# Patient Record
Sex: Female | Born: 1950 | Race: White | Hispanic: No | Smoking: Former smoker
Health system: Southern US, Community
[De-identification: ages and names within clinical notes are randomized; demographics above are authoritative.]

## PROBLEM LIST (undated history)

## (undated) DIAGNOSIS — R51 Headache: Secondary | ICD-10-CM

## (undated) DIAGNOSIS — F419 Anxiety disorder, unspecified: Secondary | ICD-10-CM

## (undated) DIAGNOSIS — G5601 Carpal tunnel syndrome, right upper limb: Secondary | ICD-10-CM

## (undated) DIAGNOSIS — G5600 Carpal tunnel syndrome, unspecified upper limb: Secondary | ICD-10-CM

## (undated) DIAGNOSIS — G8929 Other chronic pain: Secondary | ICD-10-CM

## (undated) DIAGNOSIS — M1990 Unspecified osteoarthritis, unspecified site: Secondary | ICD-10-CM

## (undated) DIAGNOSIS — K219 Gastro-esophageal reflux disease without esophagitis: Secondary | ICD-10-CM

## (undated) DIAGNOSIS — M25512 Pain in left shoulder: Secondary | ICD-10-CM

## (undated) HISTORY — PX: CHOLECYSTECTOMY: SHX55

## (undated) HISTORY — PX: TUBAL LIGATION: SHX77

## (undated) HISTORY — PX: APPENDECTOMY: SHX54

---

## 2008-07-01 ENCOUNTER — Emergency Department (HOSPITAL_COMMUNITY): Admission: EM | Admit: 2008-07-01 | Discharge: 2008-07-01 | Payer: Self-pay | Admitting: Emergency Medicine

## 2008-09-14 ENCOUNTER — Emergency Department (HOSPITAL_COMMUNITY): Admission: EM | Admit: 2008-09-14 | Discharge: 2008-09-14 | Payer: Self-pay | Admitting: Emergency Medicine

## 2010-04-30 LAB — URINALYSIS, ROUTINE W REFLEX MICROSCOPIC
Leukocytes, UA: NEGATIVE
Nitrite: NEGATIVE
Specific Gravity, Urine: <1.005 — ABNORMAL LOW (ref 1.005–1.030)
Urobilinogen, UA: 0.2 mg/dL (ref 0.0–1.0)
pH: 6.5 (ref 5.0–8.0)

## 2010-04-30 LAB — COMPREHENSIVE METABOLIC PANEL
AST: 46 U/L — ABNORMAL HIGH (ref 0–37)
BUN: 5 mg/dL — ABNORMAL LOW (ref 6–23)
CO2: 28 mEq/L (ref 19–32)
Chloride: 103 mEq/L (ref 96–112)
Creatinine, Ser: 0.79 mg/dL (ref 0.4–1.2)
GFR calc Af Amer: >60 mL/min (ref >60)
GFR calc non Af Amer: >60 mL/min (ref >60)
Total Bilirubin: 0.3 mg/dL (ref 0.3–1.2)

## 2010-04-30 LAB — CBC
HCT: 35.8 % — ABNORMAL LOW (ref 36.0–46.0)
MCV: 91.2 fL (ref 78.0–100.0)
RBC: 3.93 MIL/uL (ref 3.87–5.11)
WBC: 8.1 10*3/uL (ref 4.0–10.5)

## 2010-04-30 LAB — URINE MICROSCOPIC-ADD ON

## 2010-04-30 LAB — DIFFERENTIAL
Basophils Absolute: 0.1 10*3/uL (ref 0.0–0.1)
Basophils Relative: 1 % (ref 0–1)
Eosinophils Relative: 5 % (ref 0–5)
Lymphocytes Relative: 33 % (ref 12–46)

## 2010-04-30 LAB — LIPASE, BLOOD: Lipase: 11 U/L (ref 11–59)

## 2011-02-07 ENCOUNTER — Ambulatory Visit (INDEPENDENT_AMBULATORY_CARE_PROVIDER_SITE_OTHER): Payer: Medicaid Other | Admitting: Orthopedic Surgery

## 2011-02-07 ENCOUNTER — Encounter: Payer: Self-pay | Admitting: Orthopedic Surgery

## 2011-02-07 VITALS — Ht 62.0 in | Wt 167.0 lb

## 2011-02-07 DIAGNOSIS — G5600 Carpal tunnel syndrome, unspecified upper limb: Secondary | ICD-10-CM

## 2011-02-07 NOTE — Patient Instructions (Signed)
Start new medication continue hydrocodone   You have been scheduled for surgery  Please Go to your preoperative appointment and bring the folder that was given to you today  Please stop all blood thinners ibuprofen Naprosyn aspirin Plavix Coumadin

## 2011-02-14 ENCOUNTER — Telehealth: Payer: Self-pay | Admitting: Orthopedic Surgery

## 2011-02-14 ENCOUNTER — Encounter (HOSPITAL_COMMUNITY): Payer: Self-pay | Admitting: Pharmacy Technician

## 2011-02-14 NOTE — Telephone Encounter (Signed)
Melissa Webb from [facility], called with the following pre-op appointment information:  Surgery date: 02/24/11 (Friday), 10:30am, to follow first case  Pre-op appt:  02/17/11 (Friday)  10:45am  Patient had called 02/13/11 to inquire about surgery information.  Her ph#'s are: [phone] [facility]) and [phone] [facility]).

## 2011-02-14 NOTE — Telephone Encounter (Signed)
Patient is aware of surgery and pre op appts

## 2011-02-14 NOTE — Telephone Encounter (Signed)
Called patient, left message.

## 2011-02-15 ENCOUNTER — Other Ambulatory Visit: Payer: Self-pay | Admitting: *Deleted

## 2011-02-15 ENCOUNTER — Encounter: Payer: Self-pay | Admitting: Orthopedic Surgery

## 2011-02-15 DIAGNOSIS — G5600 Carpal tunnel syndrome, unspecified upper limb: Secondary | ICD-10-CM | POA: Insufficient documentation

## 2011-02-15 MED ORDER — HYDROCODONE-ACETAMINOPHEN 5-500 MG PO TABS: 1.0000 | ORAL_TABLET | ORAL | Status: DC | PRN

## 2011-02-15 NOTE — Progress Notes (Signed)
Patient ID: Melissa Webb, female   DOB: May 03, 1950, 61 y.o.   MRN: [id number] Peripheral from [facility]  RIGHT hand severe pain  The patient is presenting at the request of the neurologist for evaluation regarding carpal tunnel syndrome of the upper extremities.  The RIGHT hand is noted to have severe pain she is a positive carpal tunnel test.  Pain started in 2002 in the hand and wrist.  She was diagnosed with arthritis.  2 years ago she started having numbness and tingling in the RIGHT upper extremity and symptoms have gradually worsened.  Previous treatment includes steroids and Lortab.  These have been minimally effective.  She is now having sharp throbbing stabbing burning 10 out of 10 constant pain associated with numbness tingling swelling and difficulty holding small objects, twisting off tops and lifting.  Her pain is relieved with ice and partially with pain medication.  Previous treatment includes bracing.  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  She is not interested in continuing with nonoperative care.  She understands that she may have some wrist tenderness over the incision for 6 months.  She wishes to proceed with open RIGHT carpal tunnel release  Examination Ht 5\' 2"  (1.575 m)  Wt 167 lb (75.751 kg)  BMI 30.54 kg/m2 Gen. Appearance normal.  She also would be best described as endomorphic body habitus.  No gross deformities are seen.  She is oriented x3 her mood and affect are somewhat.  Has no gait disturbances.  The pulse, temperature and perfusion of the hands is normal there is no varicosities.  Mild swelling.  No edema.  Lymph nodes are normal in the upper extremities.  She has decreased sensation globally in the hand bilaterally worse in the median nerve distribution but also involving the small finger.  Reflexes are normal.   Balance normal.  Provocative test for carpal tunnel include a positive Phalen's test.  Positive carpal tunnel compression test on the RIGHT.  The hand on the RIGHT is swollen range of motion is limited she has some arthritic nodules as well.  There is no wrist or hand joint instability.  She has decreased grip strength.  The LEFT upper extremity is less symptomatic with minimal swelling some motion deficits no instability mild strength deficit.  Impression bilateral carpal tunnel syndrome RIGHT greater than LEFT  Plan RIGHT carpal tunnel release open technique  Informed consent was done in the office.

## 2011-02-15 NOTE — Telephone Encounter (Signed)
Called back to patient to schedule post op appointment

## 2011-02-17 ENCOUNTER — Encounter (HOSPITAL_COMMUNITY): Admission: RE | Admit: 2011-02-17 | Payer: Medicaid Other | Source: Ambulatory Visit

## 2011-02-20 ENCOUNTER — Encounter (HOSPITAL_COMMUNITY)
Admission: RE | Admit: 2011-02-20 | Discharge: 2011-02-20 | Disposition: A | Discharge status: Home / Self Care | Payer: Medicaid Other | Source: Ambulatory Visit | Attending: Orthopedic Surgery | Admitting: Orthopedic Surgery

## 2011-02-20 ENCOUNTER — Encounter (HOSPITAL_COMMUNITY): Payer: Self-pay

## 2011-02-20 HISTORY — DX: Gastro-esophageal reflux disease without esophagitis: K21.9

## 2011-02-20 HISTORY — DX: Anxiety disorder, unspecified: F41.9

## 2011-02-20 HISTORY — DX: Unspecified osteoarthritis, unspecified site: M19.90

## 2011-02-20 LAB — SURGICAL PCR SCREEN: Staphylococcus aureus: NEGATIVE

## 2011-02-20 NOTE — Patient Instructions (Addendum)
20 Melissa Webb  02/20/2011   Your procedure is scheduled on:  02/24/2011  Report to [facility] at  900  AM.  Call this number if you have problems the morning of surgery: [phone]   Remember:   Do not eat food:After Midnight.  May have clear liquids:until Midnight .  Clear liquids include soda, tea, black coffee, apple or grape juice, broth.  Take these medicines the morning of surgery with A SIP OF WATER: hydrocodone or vistaril   Do not wear jewelry, make-up or nail polish.  Do not wear lotions, powders, or perfumes. You may wear deodorant.  Do not shave 48 hours prior to surgery.  Do not bring valuables to the hospital.  Contacts, dentures or bridgework may not be worn into surgery.  Leave suitcase in the car. After surgery it may be brought to your room.  For patients admitted to the hospital, checkout time is 11:00 AM the day of discharge.   Patients discharged the day of surgery will not be allowed to drive home.  Name and phone number of your driver: family  Special Instructions: CHG Shower Use Special Wash: 1/2 bottle night before surgery and 1/2 bottle morning of surgery.   Please read over the following fact sheets that you were given: Coughing and Deep Breathing, MRSA Information, Surgical Site Infection Prevention, Anesthesia Post-op Instructions and Care and Recovery After Surgery Carpal Tunnel Release Carpal tunnel release is done to relieve the pressure on the nerves and tendons on the bottom side of your wrist.  LET YOUR CAREGIVER KNOW ABOUT:   Allergies to food or medicine.   Medicines taken, including vitamins, herbs, eyedrops, over-the-counter medicines, and creams.   Use of steroids (by mouth or creams).   Previous problems with anesthetics or numbing medicines.   History of bleeding problems or blood clots.   Previous surgery.   Other health problems, including diabetes and kidney problems.   Possibility of pregnancy, if this applies.  RISKS AND  COMPLICATIONS  Some problems that may happen after this procedure include:  Infection.   Damage to the nerves, arteries or tendons could occur. This would be very uncommon.   Bleeding.  BEFORE THE PROCEDURE   This surgery may be done while you are asleep (general anesthetic) or may be done under a block where only your forearm and the surgical area is numb.   If the surgery is done under a block, the numbness will gradually wear off within several hours after surgery.  HOME CARE INSTRUCTIONS   Have a responsible person with you for 24 hours.   Do not drive a car or use public transportation for 24 hours.   Only take over-the-counter or prescription medicines for pain, discomfort, or fever as directed by your caregiver. Take them as directed.   You may put ice on the palm side of the affected wrist.   Put ice in a plastic bag.   Place a towel between your skin and the bag.   Leave the ice on for 20 to 30 minutes, 4 times per day.   If you were given a splint to keep your wrist from bending, use it as directed. It is important to wear the splint at night or as directed. Use the splint for as long as you have pain or numbness in your hand, arm, or wrist. This may take 1 to 2 months.   Keep your hand raised (elevated) above the level of your heart as much as possible. This  keeps swelling down and helps with discomfort.   Change bandages (dressings) as directed.   Keep the wound clean and dry.  SEEK MEDICAL CARE IF:   You develop pain not relieved with medications.   You develop numbness of your hand.   You develop bleeding from your surgical site.   You have an oral temperature above 102 F (38.9 C).   You develop redness or swelling of the surgical site.   You develop new, unexplained problems.  SEEK IMMEDIATE MEDICAL CARE IF:   You develop a rash.   You have difficulty breathing.   You develop any reaction or side effects to medications given.  Document  Released: 04/01/2003 Document Revised: 09/21/2010 Document Reviewed: 11/15/2006 [facility] Patient Information 2012 [place], [name].PATIENT INSTRUCTIONS POST-ANESTHESIA  IMMEDIATELY FOLLOWING SURGERY:  Do not drive or operate machinery for the first twenty four hours after surgery.  Do not make any important decisions for twenty four hours after surgery or while taking narcotic pain medications or sedatives.  If you develop intractable nausea and vomiting or a severe headache please notify your doctor immediately.  FOLLOW-UP:  Please make an appointment with your surgeon as instructed. You do not need to follow up with anesthesia unless specifically instructed to do so.  WOUND CARE INSTRUCTIONS (if applicable):  Keep a dry clean dressing on the anesthesia/puncture wound site if there is drainage.  Once the wound has quit draining you may leave it open to air.  Generally you should leave the bandage intact for twenty four hours unless there is drainage.  If the epidural site drains for more than 36-48 hours please call the anesthesia department.  QUESTIONS?:  Please feel free to call your physician or the hospital operator if you have any questions, and they will be happy to assist you.     [facility] Anesthesia Department [address] [place] [phone]

## 2011-02-22 ENCOUNTER — Telehealth: Payer: Self-pay | Admitting: Orthopedic Surgery

## 2011-02-22 NOTE — Telephone Encounter (Signed)
Contacted insurer Medicaid's contact for pre-authorization ([employer]) [phone], re: out-patient surgery scheduled 02/24/11 at [facility], CPT [phone], ICD9 code 354.0.  Per automated voice response system, no pre-authorization required.

## 2011-02-23 NOTE — H&P (Signed)
  RIGHT hand severe pain  The patient is presenting at the request of the neurologist for evaluation regarding carpal tunnel syndrome of the upper extremities. The RIGHT hand is noted to have severe pain she is a positive carpal tunnel test. Pain started in 2002 in the hand and wrist. She was diagnosed with arthritis. 2 years ago she started having numbness and tingling in the RIGHT upper extremity and symptoms have gradually worsened. Previous treatment includes steroids and Lortab. These have been minimally effective.  She is now having sharp throbbing stabbing burning 10 out of 10 constant pain associated with numbness tingling swelling and difficulty holding small objects, twisting off tops and lifting. Her pain is relieved with ice and partially with pain medication.  Previous treatment includes bracing. 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  She is not interested in continuing with nonoperative care. She understands that she may have some wrist tenderness over the incision for 6 months.  She wishes to proceed with open RIGHT carpal tunnel release   Past Medical History  Diagnosis Date  . GERD (gastroesophageal reflux disease)   . Anxiety   . Arthritis     osteoarthritis    Past Surgical History  Procedure Date  . Appendectomy   . Cholecystectomy   . Tubal ligation     History   Social History  . Marital Status: Divorced    Spouse Name: N/A    Number of Children: N/A  . Years of Education: N/A   Occupational History  . Not on file.   Social History Main Topics  . Smoking status: Former Smoker -- 0.2 packs/day for 10 years    Types: Cigarettes    Quit date: 02/19/1990  . Smokeless tobacco: Not on file  . Alcohol Use: No  . Drug Use: No  . Sexually Active: Yes    Birth Control/ Protection: Post-menopausal   Other Topics  Concern  . Not on file   Social History Narrative  . No narrative on file    ROS see hpi otherwise normal  Examination  Ht 5\' 2"  (1.575 m)  Wt 167 lb (75.751 kg)  BMI 30.54 kg/m2  Gen. Appearance normal. She also would be best described as endomorphic body habitus. No gross deformities are seen. She is oriented x3 her mood and affect are somewhat.  Has no gait disturbances. The pulse, temperature and perfusion of the hands is normal there is no varicosities. Mild swelling. No edema.  Lymph nodes are normal in the upper extremities.  She has decreased sensation globally in the hand bilaterally worse in the median nerve distribution but also involving the small finger.  Reflexes are normal. Balance normal.  Provocative test for carpal tunnel include a positive Phalen's test. Positive carpal tunnel compression test on the RIGHT.  The hand on the RIGHT is swollen range of motion is limited she has some arthritic nodules as well. There is no wrist or hand joint instability. She has decreased grip strength.  The LEFT upper extremity is less symptomatic with minimal swelling some motion deficits no instability mild strength deficit.  Impression bilateral carpal tunnel syndrome RIGHT greater than LEFT  Plan RIGHT carpal tunnel release open technique  Informed consent was done in the office.

## 2011-02-24 ENCOUNTER — Ambulatory Visit (HOSPITAL_COMMUNITY)
Admission: RE | Admit: 2011-02-24 | Discharge: 2011-02-24 | Disposition: A | Discharge status: Home / Self Care | Payer: Medicaid Other | Source: Ambulatory Visit | Attending: Orthopedic Surgery | Admitting: Orthopedic Surgery

## 2011-02-24 ENCOUNTER — Ambulatory Visit (HOSPITAL_COMMUNITY): Payer: Medicaid Other | Admitting: Anesthesiology

## 2011-02-24 ENCOUNTER — Encounter (HOSPITAL_COMMUNITY): Payer: Self-pay | Admitting: *Deleted

## 2011-02-24 ENCOUNTER — Encounter (HOSPITAL_COMMUNITY): Payer: Self-pay | Admitting: Anesthesiology

## 2011-02-24 ENCOUNTER — Encounter (HOSPITAL_COMMUNITY)
Admission: RE | Disposition: A | Discharge status: Home / Self Care | Payer: Self-pay | Source: Ambulatory Visit | Attending: Orthopedic Surgery

## 2011-02-24 DIAGNOSIS — G5600 Carpal tunnel syndrome, unspecified upper limb: Secondary | ICD-10-CM

## 2011-02-24 DIAGNOSIS — Z01812 Encounter for preprocedural laboratory examination: Secondary | ICD-10-CM | POA: Insufficient documentation

## 2011-02-24 HISTORY — PX: CARPAL TUNNEL RELEASE: SHX101

## 2011-02-24 SURGERY — CARPAL TUNNEL RELEASE
Anesthesia: Regional | Site: Wrist | Laterality: Right | Wound class: Clean

## 2011-02-24 MED ORDER — FENTANYL CITRATE 0.05 MG/ML IJ SOLN
INTRAMUSCULAR | Status: AC
  Administered 2011-02-24: 50 ug via INTRAVENOUS
  Filled 2011-02-24: qty 2

## 2011-02-24 MED ORDER — CEFAZOLIN SODIUM 1-5 GM-% IV SOLN
INTRAVENOUS | Status: AC
  Filled 2011-02-24: qty 50

## 2011-02-24 MED ORDER — SODIUM CHLORIDE 0.9 % IR SOLN
Status: DC | PRN
  Administered 2011-02-24: 1000 mL

## 2011-02-24 MED ORDER — OXYCODONE-ACETAMINOPHEN 5-325 MG PO TABS
1.0000 | ORAL_TABLET | Freq: Once | ORAL | Status: AC
  Administered 2011-02-24: 1 via ORAL

## 2011-02-24 MED ORDER — ONDANSETRON HCL 4 MG/2ML IJ SOLN: 4.0000 mg | Freq: Once | INTRAMUSCULAR | Status: DC | PRN

## 2011-02-24 MED ORDER — GLYCOPYRROLATE 0.2 MG/ML IJ SOLN
0.2000 mg | Freq: Once | INTRAMUSCULAR | Status: AC
  Administered 2011-02-24: 0.2 mg via INTRAVENOUS

## 2011-02-24 MED ORDER — CHLORHEXIDINE GLUCONATE 4 % EX LIQD
60.0000 mL | Freq: Once | CUTANEOUS | Status: DC
  Filled 2011-02-24: qty 60

## 2011-02-24 MED ORDER — CEFAZOLIN SODIUM 1-5 GM-% IV SOLN: 1.0000 g | INTRAVENOUS | Status: DC

## 2011-02-24 MED ORDER — PROPOFOL 10 MG/ML IV EMUL
INTRAVENOUS | Status: DC | PRN
  Administered 2011-02-24 (×2): 50 ug/kg/min via INTRAVENOUS

## 2011-02-24 MED ORDER — OXYCODONE-ACETAMINOPHEN 5-325 MG PO TABS
ORAL_TABLET | ORAL | Status: AC
  Administered 2011-02-24: 1 via ORAL
  Filled 2011-02-24: qty 1

## 2011-02-24 MED ORDER — LIDOCAINE HCL (PF) 0.5 % IJ SOLN
INTRAMUSCULAR | Status: DC | PRN
  Administered 2011-02-24: 50 mL

## 2011-02-24 MED ORDER — MIDAZOLAM HCL 2 MG/2ML IJ SOLN
1.0000 mg | INTRAMUSCULAR | Status: DC | PRN
  Administered 2011-02-24 (×2): 2 mg via INTRAVENOUS

## 2011-02-24 MED ORDER — LACTATED RINGERS IV SOLN
INTRAVENOUS | Status: DC
  Administered 2011-02-24: 1000 mL via INTRAVENOUS

## 2011-02-24 MED ORDER — ACETAMINOPHEN 325 MG PO TABS
ORAL_TABLET | ORAL | Status: AC
  Administered 2011-02-24: 325 mg via ORAL
  Filled 2011-02-24: qty 1

## 2011-02-24 MED ORDER — LIDOCAINE HCL 1 % IJ SOLN
INTRAMUSCULAR | Status: DC | PRN
  Administered 2011-02-24: 10 mg via INTRADERMAL

## 2011-02-24 MED ORDER — GLYCOPYRROLATE 0.2 MG/ML IJ SOLN
INTRAMUSCULAR | Status: AC
  Filled 2011-02-24: qty 1

## 2011-02-24 MED ORDER — CEFAZOLIN SODIUM 1-5 GM-% IV SOLN
INTRAVENOUS | Status: DC | PRN
  Administered 2011-02-24: 1 g via INTRAVENOUS

## 2011-02-24 MED ORDER — KETOROLAC TROMETHAMINE 30 MG/ML IJ SOLN
INTRAMUSCULAR | Status: AC
  Administered 2011-02-24: 30 mg via INTRAVENOUS
  Filled 2011-02-24: qty 1

## 2011-02-24 MED ORDER — ACETAMINOPHEN 325 MG PO TABS
325.0000 mg | ORAL_TABLET | ORAL | Status: DC | PRN
  Administered 2011-02-24: 325 mg via ORAL

## 2011-02-24 MED ORDER — BUPIVACAINE HCL (PF) 0.5 % IJ SOLN
INTRAMUSCULAR | Status: DC | PRN
  Administered 2011-02-24: 30 mL

## 2011-02-24 MED ORDER — BUPIVACAINE HCL (PF) 0.5 % IJ SOLN
INTRAMUSCULAR | Status: AC
  Filled 2011-02-24: qty 30

## 2011-02-24 MED ORDER — FENTANYL CITRATE 0.05 MG/ML IJ SOLN
25.0000 ug | INTRAMUSCULAR | Status: DC | PRN
  Administered 2011-02-24 (×4): 50 ug via INTRAVENOUS

## 2011-02-24 MED ORDER — MIDAZOLAM HCL 2 MG/2ML IJ SOLN
INTRAMUSCULAR | Status: AC
  Administered 2011-02-24: 2 mg via INTRAVENOUS
  Filled 2011-02-24: qty 2

## 2011-02-24 MED ORDER — LIDOCAINE HCL (PF) 1 % IJ SOLN
INTRAMUSCULAR | Status: AC
  Filled 2011-02-24: qty 5

## 2011-02-24 MED ORDER — KETOROLAC TROMETHAMINE 30 MG/ML IJ SOLN
30.0000 mg | Freq: Once | INTRAMUSCULAR | Status: AC
  Administered 2011-02-24: 30 mg via INTRAVENOUS

## 2011-02-24 MED ORDER — OXYCODONE-ACETAMINOPHEN 5-325 MG PO TABS: 1.0000 | ORAL_TABLET | ORAL | Status: AC | PRN

## 2011-02-24 MED ORDER — SODIUM CHLORIDE 0.9 % IJ SOLN
INTRAMUSCULAR | Status: AC
  Filled 2011-02-24: qty 10

## 2011-02-24 MED ORDER — LIDOCAINE HCL (PF) 0.5 % IJ SOLN
INTRAMUSCULAR | Status: AC
  Filled 2011-02-24: qty 50

## 2011-02-24 MED ORDER — FENTANYL CITRATE 0.05 MG/ML IJ SOLN
INTRAMUSCULAR | Status: DC | PRN
  Administered 2011-02-24: 50 ug via INTRAVENOUS

## 2011-02-24 MED ORDER — PROPOFOL 10 MG/ML IV EMUL
INTRAVENOUS | Status: AC
  Filled 2011-02-24: qty 20

## 2011-02-24 SURGICAL SUPPLY — 33 items
BAG HAMPER (MISCELLANEOUS) ×2 IMPLANT
BANDAGE ELASTIC 3 VELCRO NS (GAUZE/BANDAGES/DRESSINGS) ×2 IMPLANT
BANDAGE ESMARK 4X12 BL STRL LF (DISPOSABLE) ×1 IMPLANT
BANDAGE GAUZE ELAST BULKY 4 IN (GAUZE/BANDAGES/DRESSINGS) ×2 IMPLANT
BNDG COHESIVE 4X5 TAN NS LF (GAUZE/BANDAGES/DRESSINGS) ×2 IMPLANT
BNDG ESMARK 4X12 BLUE STRL LF (DISPOSABLE) ×2
CHLORAPREP W/TINT 26ML (MISCELLANEOUS) ×2 IMPLANT
CLOTH BEACON ORANGE TIMEOUT ST (SAFETY) ×2 IMPLANT
COVER LIGHT HANDLE STERIS (MISCELLANEOUS) ×4 IMPLANT
CUFF TOURNIQUET SINGLE 18IN (TOURNIQUET CUFF) ×2 IMPLANT
DECANTER SPIKE VIAL GLASS SM (MISCELLANEOUS) ×2 IMPLANT
DRSG XEROFORM 1X8 (GAUZE/BANDAGES/DRESSINGS) ×2 IMPLANT
ELECT NEEDLE TIP 2.8 STRL (NEEDLE) ×2 IMPLANT
ELECT REM PT RETURN 9FT ADLT (ELECTROSURGICAL) ×2
ELECTRODE REM PT RTRN 9FT ADLT (ELECTROSURGICAL) ×1 IMPLANT
GLOVE ECLIPSE 6.5 STRL STRAW (GLOVE) ×2 IMPLANT
GLOVE EXAM NITRILE MD LF STRL (GLOVE) ×2 IMPLANT
GLOVE INDICATOR 7.0 STRL GRN (GLOVE) ×2 IMPLANT
GLOVE SKINSENSE NS SZ8.0 LF (GLOVE) ×1
GLOVE SKINSENSE STRL SZ8.0 LF (GLOVE) ×1 IMPLANT
GLOVE SS N UNI LF 8.5 STRL (GLOVE) ×2 IMPLANT
GOWN STRL REIN XL XLG (GOWN DISPOSABLE) ×6 IMPLANT
HAND ALUMI XLG (SOFTGOODS) ×2 IMPLANT
KIT ROOM TURNOVER APOR (KITS) ×2 IMPLANT
MANIFOLD NEPTUNE II (INSTRUMENTS) ×2 IMPLANT
NEEDLE HYPO 21X1.5 SAFETY (NEEDLE) ×2 IMPLANT
NS IRRIG 1000ML POUR BTL (IV SOLUTION) ×2 IMPLANT
PACK BASIC LIMB (CUSTOM PROCEDURE TRAY) ×2 IMPLANT
PAD ARMBOARD 7.5X6 YLW CONV (MISCELLANEOUS) ×2 IMPLANT
SET BASIN LINEN APH (SET/KITS/TRAYS/PACK) ×2 IMPLANT
SPONGE GAUZE 4X4 12PLY (GAUZE/BANDAGES/DRESSINGS) ×2 IMPLANT
SUT ETHILON 3 0 FSL (SUTURE) ×2 IMPLANT
SYR 30ML LL (SYRINGE) ×2 IMPLANT

## 2011-02-24 NOTE — Op Note (Signed)
02/24/2011  10:40 AM  PATIENT:  Melissa Webb  61 y.o. female  PRE-OPERATIVE DIAGNOSIS:  carpal tunnel syndrome - right  POST-OPERATIVE DIAGNOSIS:  carpal tunnel syndrome - right  PROCEDURE:  Procedure(s): CARPAL TUNNEL RELEASE  SURGEON:  Surgeon(s): Fuller Canada, MD  PHYSICIAN ASSISTANT:   ASSISTANTS: none   ANESTHESIA:   regional  EBL:  Total I/O In: 500 [I.V.:500] Out: 0   BLOOD ADMINISTERED:none  DRAINS: none   LOCAL MEDICATIONS USED:  MARCAINE 30 CC  SPECIMEN:  No Specimen  DISPOSITION OF SPECIMEN:  N/A  COUNTS:  YES  TOURNIQUET:   Total Tourniquet Time Documented: Upper Arm (Right) - 29 minutes  DICTATION: .Dragon Dictation  PLAN OF CARE: Discharge to home after PACU  PATIENT DISPOSITION:  PACU - hemodynamically stable.   Delay start of Pharmacological VTE agent (>24hrs) due to surgical blood loss or risk of bleeding:  NA  Details of procedure  The right hand was confirmed as a surgical site marked as such. Chart was reviewed and updated. The patient was taken to the operating room given a gram of Ancef. She Bier block the right upper extremity.  After sterile prep and drape the timeout procedure was executed.  A straight incision was made over the carpal tunnel. Subcutaneous tissue was divided down to the palmar fascia. Blunt dissection was carried out to the distal aspect of the transverse carpal ligament. Abundant treatment was passed beneath the ligament and the ligament was released sharply. The median nerve was found to be severely compressed with a severe apple core appearance with discoloration.  The wound was irrigated no space-occupying lesions in the carpal tunnel.  After irrigation the wound is closed with 3-0 interrupted nylon sutures and injected with 30 cc of Marcaine plain  Followed by sterile dressing  The tourniquet was released and the fingers have good color and capillary refill

## 2011-02-24 NOTE — Anesthesia Procedure Notes (Addendum)
Anesthesia Regional Block:  Bier block (IV Regional)  Pre-Anesthetic Checklist: ,, timeout performed, Correct Patient, Correct Site, Correct Laterality, Correct Procedure,, site marked, risks and benefits discussed, Surgical consent, Pre-op evaluation  Laterality: Right  Prep: alcohol swabs        Motor weakness within 3 minutes. Bier block (IV Regional) Narrative:   Performed by: Personally  Resident/CRNA: Glynn Octave, CRNA  Additional Notes: Tourniquet checked, Rt arm exsanguinated with esmarch, #22 IV in Rt hand, checked for blood return and injected 50cc .5% lidocaine.

## 2011-02-24 NOTE — Brief Op Note (Signed)
02/24/2011  10:40 AM  PATIENT:  Melissa Webb  61 y.o. female  PRE-OPERATIVE DIAGNOSIS:  carpal tunnel syndrome - right  POST-OPERATIVE DIAGNOSIS:  carpal tunnel syndrome - right  PROCEDURE:  Procedure(s): CARPAL TUNNEL RELEASE  SURGEON:  Surgeon(s): Fuller Canada, MD  PHYSICIAN ASSISTANT:   ASSISTANTS: none   ANESTHESIA:   regional  EBL:  Total I/O In: 500 [I.V.:500] Out: 0   BLOOD ADMINISTERED:none  DRAINS: none   LOCAL MEDICATIONS USED:  MARCAINE 30 CC  SPECIMEN:  No Specimen  DISPOSITION OF SPECIMEN:  N/A  COUNTS:  YES  TOURNIQUET:   Total Tourniquet Time Documented: Upper Arm (Right) - 29 minutes  DICTATION: .Dragon Dictation  PLAN OF CARE: Discharge to home after PACU  PATIENT DISPOSITION:  PACU - hemodynamically stable.   Delay start of Pharmacological VTE agent (>24hrs) due to surgical blood loss or risk of bleeding:  NA

## 2011-02-24 NOTE — Transfer of Care (Signed)
Immediate Anesthesia Transfer of Care Note  Patient: Melissa Webb  Procedure(s) Performed:  CARPAL TUNNEL RELEASE  Patient Location: PACU  Anesthesia Type: MAC  Level of Consciousness: awake, alert  and oriented  Airway & Oxygen Therapy: Patient Spontanous Breathing and Patient connected to nasal cannula oxygen  Post-op Assessment: Report given to PACU RN  Post vital signs: Reviewed and stable  Complications: No apparent anesthesia complications

## 2011-02-24 NOTE — Anesthesia Postprocedure Evaluation (Signed)
  Anesthesia Post-op Note  Patient: Melissa Webb  Procedure(s) Performed:  CARPAL TUNNEL RELEASE  Patient Location: PACU  Anesthesia Type: MAC  Level of Consciousness: awake, alert  and oriented  Airway and Oxygen Therapy: Patient Spontanous Breathing  Post-op Pain: none  Post-op Assessment: Post-op Vital signs reviewed, Patient's Cardiovascular Status Stable, Respiratory Function Stable and No signs of Nausea or vomiting  Post-op Vital Signs: Reviewed and stable  Complications: No apparent anesthesia complications

## 2011-02-24 NOTE — Anesthesia Preprocedure Evaluation (Signed)
Anesthesia Evaluation  Patient identified by MRN, date of birth, ID band Patient awake    Reviewed: Allergy & Precautions, H&P , NPO status , Patient's Chart, lab work & pertinent test results  Airway Mallampati: I TM Distance: >3 FB Neck ROM: Full    Dental No notable dental hx.    Pulmonary    Pulmonary exam normal       Cardiovascular neg cardio ROS Regular Normal    Neuro/Psych Anxiety  Neuromuscular disease    GI/Hepatic Neg liver ROS, GERD-  Medicated and Controlled,  Endo/Other  Negative Endocrine ROS  Renal/GU negative Renal ROS  Genitourinary negative   Musculoskeletal negative musculoskeletal ROS (+)   Abdominal Normal abdominal exam  (+)   Peds  Hematology negative hematology ROS (+)   Anesthesia Other Findings   Reproductive/Obstetrics negative OB ROS                           Anesthesia Physical Anesthesia Plan  ASA: I  Anesthesia Plan: Bier Block   Post-op Pain Management:    Induction: Intravenous  Airway Management Planned: Nasal Cannula  Additional Equipment:   Intra-op Plan:   Post-operative Plan:   Informed Consent: I have reviewed the patients History and Physical, chart, labs and discussed the procedure including the risks, benefits and alternatives for the proposed anesthesia with the patient or authorized representative who has indicated his/her understanding and acceptance.     Plan Discussed with: CRNA  Anesthesia Plan Comments:         Anesthesia Quick Evaluation

## 2011-02-24 NOTE — Interval H&P Note (Signed)
History and Physical Interval Note:  02/24/2011 9:51 AM  Melissa Webb  has presented today for surgery, with the diagnosis of carpal tunnel syndrome - right  The various methods of treatment have been discussed with the patient and family. After consideration of risks, benefits and other options for treatment, the patient has consented to  Procedure(s): right  CARPAL TUNNEL RELEASE as a surgical intervention .  The patients' history has been reviewed, patient examined, no change in status, stable for surgery.  I have reviewed the patients' chart and labs.  Questions were answered to the patient's satisfaction.     Fuller Canada

## 2011-02-27 ENCOUNTER — Encounter (HOSPITAL_COMMUNITY): Payer: Self-pay | Admitting: Orthopedic Surgery

## 2011-02-27 ENCOUNTER — Ambulatory Visit (INDEPENDENT_AMBULATORY_CARE_PROVIDER_SITE_OTHER): Payer: Medicaid Other | Admitting: Orthopedic Surgery

## 2011-02-27 VITALS — Ht 62.0 in | Wt 167.0 lb

## 2011-02-27 DIAGNOSIS — Z9889 Other specified postprocedural states: Secondary | ICD-10-CM

## 2011-02-27 MED ORDER — HYDROCODONE-ACETAMINOPHEN 10-325 MG PO TABS: 1.0000 | ORAL_TABLET | ORAL | Status: DC | PRN

## 2011-02-27 NOTE — Patient Instructions (Signed)
Keep clean and dry change bandage as needed

## 2011-02-27 NOTE — Progress Notes (Signed)
Patient ID: Melissa Webb, female   DOB: 25-May-1950, 61 y.o.   MRN: [id number]  Postop day #3  Postop visit #1  Open RIGHT carpal tunnel release  Patient is doing well her incision is clean.  The Percocet gave her some upper GI discomfort and she would like a prescription for hydrocodone  She is given hydrocodone as Norco 10 mg one q. 4 p.r.n. Pain #50 for return date set for February 13

## 2011-03-08 ENCOUNTER — Ambulatory Visit (INDEPENDENT_AMBULATORY_CARE_PROVIDER_SITE_OTHER): Payer: Medicaid Other | Admitting: Orthopedic Surgery

## 2011-03-08 ENCOUNTER — Encounter: Payer: Self-pay | Admitting: Orthopedic Surgery

## 2011-03-08 DIAGNOSIS — Z9889 Other specified postprocedural states: Secondary | ICD-10-CM

## 2011-03-08 DIAGNOSIS — L0390 Cellulitis, unspecified: Secondary | ICD-10-CM

## 2011-03-08 DIAGNOSIS — L0291 Cutaneous abscess, unspecified: Secondary | ICD-10-CM

## 2011-03-08 MED ORDER — CEPHALEXIN 500 MG PO CAPS: 500.0000 mg | ORAL_CAPSULE | Freq: Four times a day (QID) | ORAL | Status: AC

## 2011-03-08 MED ORDER — HYDROCODONE-ACETAMINOPHEN 10-325 MG PO TABS: 1.0000 | ORAL_TABLET | ORAL | Status: DC | PRN

## 2011-03-08 NOTE — Progress Notes (Signed)
Patient ID: Melissa Webb, female   DOB: Aug 08, 1950, 61 y.o.   MRN: [id number] Chief Complaint  Patient presents with  . Routine Post Op    post op 2, Rt CT, DOS 02/24/11   The patient decided to wash her dog her wound got wet and opened up it drained she called the ER they never called her back her wound is separated she is surrounding synovitis no expressible purulence  Stitches were removed antibiotic ointment was applied with a sterile dressing.  She was started on Keflex 4 times a day come back in a week for dressing check and change

## 2011-03-08 NOTE — Patient Instructions (Signed)
Keep clean and dry   Dr will change dressing

## 2011-03-15 ENCOUNTER — Encounter: Payer: Self-pay | Admitting: Orthopedic Surgery

## 2011-03-15 ENCOUNTER — Ambulatory Visit (INDEPENDENT_AMBULATORY_CARE_PROVIDER_SITE_OTHER): Payer: Medicaid Other | Admitting: Orthopedic Surgery

## 2011-03-15 VITALS — Ht 62.0 in | Wt 167.0 lb

## 2011-03-15 DIAGNOSIS — G5600 Carpal tunnel syndrome, unspecified upper limb: Secondary | ICD-10-CM

## 2011-03-15 MED ORDER — GABAPENTIN 100 MG PO CAPS: 100.0000 mg | ORAL_CAPSULE | Freq: Three times a day (TID) | ORAL | Status: DC

## 2011-03-15 NOTE — Patient Instructions (Addendum)
Change dressing daily   Finish antibiotics

## 2011-03-15 NOTE — Progress Notes (Signed)
Patient ID: Melissa Webb, female   DOB: 12-14-1950, 61 y.o.   MRN: [id number] Chief Complaint  Patient presents with  . Follow-up    1 wwek recheck and dressing change of right CT.   A stab wound infection RIGHT carpal tunnel.  Patient on Keflex doing well  Requests Neurontin since it helped with her LEFT carpal tunnel symptoms and we are awaiting wound healing from the RIGHT carpal tunnel release we'll address surgical treatment of the LEFT carpal tunnels  Start Neurontin 100 mg t.i.d.

## 2011-03-27 ENCOUNTER — Other Ambulatory Visit: Payer: Self-pay | Admitting: *Deleted

## 2011-03-27 MED ORDER — HYDROCODONE-ACETAMINOPHEN 7.5-325 MG PO TABS
1.0000 | ORAL_TABLET | ORAL | Status: DC | PRN
Start: 2011-03-27 — End: 2011-04-05

## 2011-04-05 ENCOUNTER — Ambulatory Visit (INDEPENDENT_AMBULATORY_CARE_PROVIDER_SITE_OTHER): Payer: Medicaid Other | Admitting: Orthopedic Surgery

## 2011-04-05 ENCOUNTER — Encounter: Payer: Self-pay | Admitting: Orthopedic Surgery

## 2011-04-05 VITALS — Ht 62.0 in | Wt 167.0 lb

## 2011-04-05 DIAGNOSIS — G5600 Carpal tunnel syndrome, unspecified upper limb: Secondary | ICD-10-CM

## 2011-04-05 MED ORDER — HYDROCODONE-ACETAMINOPHEN 7.5-325 MG PO TABS: 1.0000 | ORAL_TABLET | ORAL | Status: DC | PRN

## 2011-04-05 NOTE — Patient Instructions (Addendum)
Left carpal tunnel release  Surgery scheduled for April 14, 2011 @ 10:35am Pre op @ [facility] short stay April 10, 2011 12:45 Risks and benefits as before Carpal Tunnel Release Carpal tunnel release is done to relieve the pressure on the nerves and tendons on the bottom side of your wrist.   LET YOUR CAREGIVER KNOW ABOUT:    Allergies to food or medicine.   Medicines taken, including vitamins, herbs, eyedrops, over-the-counter medicines, and creams.   Use of steroids (by mouth or creams).   Previous problems with anesthetics or numbing medicines.   History of bleeding problems or blood clots.   Previous surgery.   Other health problems, including diabetes and kidney problems.   Possibility of pregnancy, if this applies.  RISKS AND COMPLICATIONS   Some problems that may happen after this procedure include:  Infection.   Damage to the nerves, arteries or tendons could occur. This would be very uncommon.   Bleeding.  BEFORE THE PROCEDURE    This surgery may be done while you are asleep (general anesthetic) or may be done under a block where only your forearm and the surgical area is numb.   If the surgery is done under a block, the numbness will gradually wear off within several hours after surgery.  HOME CARE INSTRUCTIONS    Have a responsible person with you for 24 hours.   Do not drive a car or use public transportation for 24 hours.   Only take over-the-counter or prescription medicines for pain, discomfort, or fever as directed by your caregiver. Take them as directed.   You may put ice on the palm side of the affected wrist.   Put ice in a plastic bag.   Place a towel between your skin and the bag.   Leave the ice on for 20 to 30 minutes, 4 times per day.   If you were given a splint to keep your wrist from bending, use it as directed. It is important to wear the splint at night or as directed. Use the splint for as long as you have pain or numbness in your hand, arm,  or wrist. This may take 1 to 2 months.   Keep your hand raised (elevated) above the level of your heart as much as possible. This keeps swelling down and helps with discomfort.   Change bandages (dressings) as directed.   Keep the wound clean and dry.  SEEK MEDICAL CARE IF:    You develop pain not relieved with medications.   You develop numbness of your hand.   You develop bleeding from your surgical site.   You have an oral temperature above 102 F (38.9 C).   You develop redness or swelling of the surgical site.   You develop new, unexplained problems.  SEEK IMMEDIATE MEDICAL CARE IF:    You develop a rash.   You have difficulty breathing.   You develop any reaction or side effects to medications given.  Document Released: 04/01/2003 Document Revised: 12/29/2010 Document Reviewed: 11/15/2006 [facility] Patient Information 2012 [place], [name].

## 2011-04-05 NOTE — Progress Notes (Signed)
Patient ID: Melissa Webb, female   DOB: July 29, 1950, 61 y.o.   MRN: [id number] Chief Complaint  Patient presents with  . Follow-up    Recheck incision.     Postop RIGHT carpal tunnel release had a wound infection treated with p.o. Antibiotics he'll nicely no symptoms at this time  LEFT carpal tunnel syndrome positive nerve study would like to have LEFT done  Preoperative LEFT carpal tunnel release.

## 2011-04-06 ENCOUNTER — Telehealth: Payer: Self-pay | Admitting: Orthopedic Surgery

## 2011-04-06 NOTE — Telephone Encounter (Signed)
Contacted Medicaid's prior authorization contact, [employer], ph# [phone], re: out-patient surgery scheduled for 04/14/11 at [facility].  CPT code: 09811, ICD9 code 354.0.  Per automated voice response system, no pre-authorization is required.

## 2011-04-07 NOTE — Patient Instructions (Addendum)
22 Melissa Webb  04/07/2011   Your procedure is scheduled on:   04/14/2011  Report to [facility] at  850  AM.  Call this number if you have problems the morning of surgery: [phone]   Remember:   Do not eat food:After Midnight.  May have clear liquids:until Midnight .  Clear liquids include soda, tea, black coffee, apple or grape juice, broth.  Take these medicines the morning of surgery with A SIP OF WATER:  Neurontin,norco,xanax,hydroxyzine   Do not wear jewelry, make-up or nail polish.  Do not wear lotions, powders, or perfumes. You may wear deodorant.  Do not shave 48 hours prior to surgery.  Do not bring valuables to the hospital.  Contacts, dentures or bridgework may not be worn into surgery.  Leave suitcase in the car. After surgery it may be brought to your room.  For patients admitted to the hospital, checkout time is 11:00 AM the day of discharge.   Patients discharged the day of surgery will not be allowed to drive home.  Name and phone number of your driver: family  Special Instructions: CHG Shower Use Special Wash: 1/2 bottle night before surgery and 1/2 bottle morning of surgery.   Please read over the following fact sheets that you were given: Pain Booklet, MRSA Information, Surgical Site Infection Prevention, Anesthesia Post-op Instructions and Care and Recovery After Surgery Carpal Tunnel Release Carpal tunnel release is done to relieve the pressure on the nerves and tendons on the bottom side of your wrist.  LET YOUR CAREGIVER KNOW ABOUT:   Allergies to food or medicine.   Medicines taken, including vitamins, herbs, eyedrops, over-the-counter medicines, and creams.   Use of steroids (by mouth or creams).   Previous problems with anesthetics or numbing medicines.   History of bleeding problems or blood clots.   Previous surgery.   Other health problems, including diabetes and kidney problems.   Possibility of pregnancy, if this applies.  RISKS AND  COMPLICATIONS  Some problems that may happen after this procedure include:  Infection.   Damage to the nerves, arteries or tendons could occur. This would be very uncommon.   Bleeding.  BEFORE THE PROCEDURE   This surgery may be done while you are asleep (general anesthetic) or may be done under a block where only your forearm and the surgical area is numb.   If the surgery is done under a block, the numbness will gradually wear off within several hours after surgery.  HOME CARE INSTRUCTIONS   Have a responsible person with you for 24 hours.   Do not drive a car or use public transportation for 24 hours.   Only take over-the-counter or prescription medicines for pain, discomfort, or fever as directed by your caregiver. Take them as directed.   You may put ice on the palm side of the affected wrist.   Put ice in a plastic bag.   Place a towel between your skin and the bag.   Leave the ice on for 20 to 30 minutes, 4 times per day.   If you were given a splint to keep your wrist from bending, use it as directed. It is important to wear the splint at night or as directed. Use the splint for as long as you have pain or numbness in your hand, arm, or wrist. This may take 1 to 2 months.   Keep your hand raised (elevated) above the level of your heart as much as possible. This keeps  swelling down and helps with discomfort.   Change bandages (dressings) as directed.   Keep the wound clean and dry.  SEEK MEDICAL CARE IF:   You develop pain not relieved with medications.   You develop numbness of your hand.   You develop bleeding from your surgical site.   You have an oral temperature above 102 F (38.9 C).   You develop redness or swelling of the surgical site.   You develop new, unexplained problems.  SEEK IMMEDIATE MEDICAL CARE IF:   You develop a rash.   You have difficulty breathing.   You develop any reaction or side effects to medications given.  Document  Released: 04/01/2003 Document Revised: 12/29/2010 Document Reviewed: 11/15/2006 [facility] Patient Information 2012 Melissa Webb, [name].PATIENT INSTRUCTIONS POST-ANESTHESIA  IMMEDIATELY FOLLOWING SURGERY:  Do not drive or operate machinery for the first twenty four hours after surgery.  Do not make any important decisions for twenty four hours after surgery or while taking narcotic pain medications or sedatives.  If you develop intractable nausea and vomiting or a severe headache please notify your doctor immediately.  FOLLOW-UP:  Please make an appointment with your surgeon as instructed. You do not need to follow up with anesthesia unless specifically instructed to do so.  WOUND CARE INSTRUCTIONS (if applicable):  Keep a dry clean dressing on the anesthesia/puncture wound site if there is drainage.  Once the wound has quit draining you may leave it open to air.  Generally you should leave the bandage intact for twenty four hours unless there is drainage.  If the epidural site drains for more than 36-48 hours please call the anesthesia department.  QUESTIONS?:  Please feel free to call your physician or the hospital operator if you have any questions, and they will be happy to assist you.     [facility] Anesthesia Department [address] [place] [phone]

## 2011-04-10 ENCOUNTER — Encounter (HOSPITAL_COMMUNITY)
Admission: RE | Admit: 2011-04-10 | Discharge: 2011-04-10 | Disposition: A | Discharge status: Home / Self Care | Payer: Medicaid Other | Source: Ambulatory Visit | Attending: Orthopedic Surgery | Admitting: Orthopedic Surgery

## 2011-04-10 ENCOUNTER — Encounter (HOSPITAL_COMMUNITY): Payer: Self-pay

## 2011-04-10 ENCOUNTER — Encounter (HOSPITAL_COMMUNITY): Payer: Self-pay | Admitting: Pharmacy Technician

## 2011-04-10 LAB — SURGICAL PCR SCREEN
MRSA, PCR: NEGATIVE
Staphylococcus aureus: NEGATIVE

## 2011-04-10 LAB — HEMOGLOBIN AND HEMATOCRIT, BLOOD: Hemoglobin: 13.4 g/dL (ref 12.0–15.0)

## 2011-04-13 NOTE — H&P (Signed)
Melissa Webb is an 61 y.o. female.   Chief Complaint: PAIN AND PARESTHESIAS LEFT HAND  HPI: This is a 61 year old female status post right carpal tunnel release she has similar pain and paresthesias with numbness and tingling and weakness of the left upper extremity and presents for left carpal tunnel release.  Past Medical History  Diagnosis Date  . GERD (gastroesophageal reflux disease)   . Anxiety   . Arthritis     osteoarthritis    Past Surgical History  Procedure Date  . Appendectomy   . Cholecystectomy   . Tubal ligation   . Carpal tunnel release 02/24/2011    Procedure: CARPAL TUNNEL RELEASE;  Surgeon: Fuller Canada, MD;  Location: [facility];  Service: Orthopedics;  Laterality: Right;    Family History  Problem Relation Age of Onset  . Heart disease    . Arthritis    . Cancer    . Anesthesia problems Neg Hx   . Hypotension Neg Hx   . Malignant hyperthermia Neg Hx   . Pseudochol deficiency Neg Hx    Social History:  reports that she quit smoking about 21 years ago. Her smoking use included Cigarettes. She has a 2.5 pack-year smoking history. She does not have any smokeless tobacco history on file. She reports that she does not drink alcohol or use illicit drugs.  Allergies: No Known Allergies  No current facility-administered medications on file as of .   Medications Prior to Admission  Medication Sig Dispense Refill  . alprazolam (XANAX) 2 MG tablet Take 2 mg by mouth 2 (two) times daily as needed. For anxiety      . aspirin EC 81 MG tablet Take 81 mg by mouth daily.      Marland Kitchen doxycycline (VIBRA-TABS) 100 MG tablet Take 100 mg by mouth 2 (two) times daily.      Marland Kitchen gabapentin (NEURONTIN) 100 MG capsule Take 1 capsule (100 mg total) by mouth 3 (three) times daily.  60 capsule  2  . HYDROcodone-acetaminophen (NORCO) 7.5-325 MG per tablet Take 1 tablet by mouth every 4 (four) hours as needed. For pain      . hydrOXYzine (ATARAX/VISTARIL) 10 MG tablet Take 20-30 mg by mouth 2  (two) times daily. 30 mg in the morning and 20 mg at bedtime        No results found for this or any previous visit (from the past 48 hour(s)). No results found.  ROS  There were no vitals taken for this visit. Physical Exam  Physical Exam  Musculoskeletal:       Vital signs are stable as recorded  General appearance is normal  The patient is alert and oriented x3  The patient's mood and affect are normal  Gait assessment: NORMAL  The cardiovascular exam reveals normal pulses and temperature without edema swelling.  The lymphatic system is negative for palpable lymph nodes  The sensory exam is ABNORMAL ON THE LEFT HAND DECREASED SENSTAION IN THE THUMB. INDEX AND LONG FINGERS   TINELS NORMAL   PHALENS +  There are no pathologic reflexes.  Balance is normal.   Exam of the LEFT HAND AND WRIST  Inspection TENDERNESS IN THE CARPAL TUNNEL AREA Range of motion NORMAL  Stability NORMAL  Strength MILD WEAKNESS WITH POWER GRIP  Skin RUBOR     Assessment/Plan LEFT CTR FOR LEFT CARPAL TUNNEL SYNDROME   Fuller Canada 04/13/2011, 1:24 PM

## 2011-04-14 ENCOUNTER — Ambulatory Visit (HOSPITAL_COMMUNITY)
Admission: RE | Admit: 2011-04-14 | Discharge: 2011-04-14 | Disposition: A | Discharge status: Home / Self Care | Payer: Medicaid Other | Source: Ambulatory Visit | Attending: Orthopedic Surgery | Admitting: Orthopedic Surgery

## 2011-04-14 ENCOUNTER — Ambulatory Visit (HOSPITAL_COMMUNITY): Payer: Medicaid Other | Admitting: Anesthesiology

## 2011-04-14 ENCOUNTER — Encounter (HOSPITAL_COMMUNITY)
Admission: RE | Disposition: A | Discharge status: Home / Self Care | Payer: Self-pay | Source: Ambulatory Visit | Attending: Orthopedic Surgery

## 2011-04-14 ENCOUNTER — Encounter (HOSPITAL_COMMUNITY): Payer: Self-pay | Admitting: *Deleted

## 2011-04-14 ENCOUNTER — Encounter (HOSPITAL_COMMUNITY): Payer: Self-pay | Admitting: Anesthesiology

## 2011-04-14 DIAGNOSIS — Z01812 Encounter for preprocedural laboratory examination: Secondary | ICD-10-CM | POA: Insufficient documentation

## 2011-04-14 DIAGNOSIS — Z7982 Long term (current) use of aspirin: Secondary | ICD-10-CM | POA: Insufficient documentation

## 2011-04-14 DIAGNOSIS — G5600 Carpal tunnel syndrome, unspecified upper limb: Secondary | ICD-10-CM | POA: Insufficient documentation

## 2011-04-14 HISTORY — PX: CARPAL TUNNEL RELEASE: SHX101

## 2011-04-14 SURGERY — CARPAL TUNNEL RELEASE
Anesthesia: Regional | Site: Wrist | Laterality: Left | Wound class: Clean

## 2011-04-14 MED ORDER — CEFAZOLIN SODIUM 1-5 GM-% IV SOLN: 1.0000 g | INTRAVENOUS | Status: DC

## 2011-04-14 MED ORDER — LIDOCAINE HCL (PF) 0.5 % IJ SOLN
INTRAMUSCULAR | Status: DC | PRN
  Administered 2011-04-14: 50 mL via INTRAVENOUS

## 2011-04-14 MED ORDER — MIDAZOLAM HCL 2 MG/2ML IJ SOLN
INTRAMUSCULAR | Status: AC
  Filled 2011-04-14: qty 2

## 2011-04-14 MED ORDER — MIDAZOLAM HCL 5 MG/5ML IJ SOLN
INTRAMUSCULAR | Status: DC | PRN
  Administered 2011-04-14 (×2): 1 mg via INTRAVENOUS

## 2011-04-14 MED ORDER — CEFAZOLIN SODIUM 1-5 GM-% IV SOLN
INTRAVENOUS | Status: DC | PRN
  Administered 2011-04-14: 1 g via INTRAVENOUS

## 2011-04-14 MED ORDER — FENTANYL CITRATE 0.05 MG/ML IJ SOLN
INTRAMUSCULAR | Status: AC
  Administered 2011-04-14: 50 ug via INTRAVENOUS
  Filled 2011-04-14: qty 2

## 2011-04-14 MED ORDER — SODIUM CHLORIDE 0.9 % IR SOLN
Status: DC | PRN
  Administered 2011-04-14: 1000 mL

## 2011-04-14 MED ORDER — BUPIVACAINE HCL (PF) 0.5 % IJ SOLN
INTRAMUSCULAR | Status: AC
  Filled 2011-04-14: qty 30

## 2011-04-14 MED ORDER — ACETAMINOPHEN 10 MG/ML IV SOLN
INTRAVENOUS | Status: AC
  Administered 2011-04-14: 1000 mg via INTRAVENOUS
  Filled 2011-04-14: qty 100

## 2011-04-14 MED ORDER — FENTANYL CITRATE 0.05 MG/ML IJ SOLN
INTRAMUSCULAR | Status: DC | PRN
  Administered 2011-04-14 (×2): 50 ug via INTRAVENOUS

## 2011-04-14 MED ORDER — CHLORHEXIDINE GLUCONATE 4 % EX LIQD
60.0000 mL | Freq: Once | CUTANEOUS | Status: DC
  Filled 2011-04-14: qty 60

## 2011-04-14 MED ORDER — LIDOCAINE HCL (PF) 1 % IJ SOLN
INTRAMUSCULAR | Status: AC
  Filled 2011-04-14: qty 5

## 2011-04-14 MED ORDER — ONDANSETRON HCL 4 MG/2ML IJ SOLN
INTRAMUSCULAR | Status: AC
  Administered 2011-04-14: 4 mg via INTRAVENOUS
  Filled 2011-04-14: qty 2

## 2011-04-14 MED ORDER — GLYCOPYRROLATE 0.2 MG/ML IJ SOLN
INTRAMUSCULAR | Status: AC
  Filled 2011-04-14: qty 1

## 2011-04-14 MED ORDER — GLYCOPYRROLATE 0.2 MG/ML IJ SOLN
0.2000 mg | Freq: Once | INTRAMUSCULAR | Status: AC
  Administered 2011-04-14: 10:00:00 via INTRAVENOUS

## 2011-04-14 MED ORDER — FENTANYL CITRATE 0.05 MG/ML IJ SOLN
INTRAMUSCULAR | Status: AC
  Filled 2011-04-14: qty 2

## 2011-04-14 MED ORDER — BUPIVACAINE HCL (PF) 0.5 % IJ SOLN
INTRAMUSCULAR | Status: DC | PRN
  Administered 2011-04-14: 20 mL

## 2011-04-14 MED ORDER — ONDANSETRON HCL 4 MG/2ML IJ SOLN: 4.0000 mg | Freq: Once | INTRAMUSCULAR | Status: DC | PRN

## 2011-04-14 MED ORDER — OXYCODONE HCL 5 MG PO TABS
5.0000 mg | ORAL_TABLET | ORAL | Status: DC
  Administered 2011-04-14: 5 mg via ORAL

## 2011-04-14 MED ORDER — ACETAMINOPHEN 10 MG/ML IV SOLN
1000.0000 mg | Freq: Once | INTRAVENOUS | Status: AC
  Administered 2011-04-14: 1000 mg via INTRAVENOUS

## 2011-04-14 MED ORDER — LACTATED RINGERS IV SOLN
INTRAVENOUS | Status: DC
  Administered 2011-04-14: 10:00:00 via INTRAVENOUS

## 2011-04-14 MED ORDER — PROPOFOL 10 MG/ML IV EMUL
INTRAVENOUS | Status: DC | PRN
  Administered 2011-04-14: 75 ug/kg/min via INTRAVENOUS

## 2011-04-14 MED ORDER — ACETAMINOPHEN 325 MG PO TABS: 325.0000 mg | ORAL_TABLET | ORAL | Status: DC | PRN

## 2011-04-14 MED ORDER — FENTANYL CITRATE 0.05 MG/ML IJ SOLN
25.0000 ug | INTRAMUSCULAR | Status: DC | PRN
  Administered 2011-04-14: 50 ug via INTRAVENOUS

## 2011-04-14 MED ORDER — PROPOFOL 10 MG/ML IV EMUL
INTRAVENOUS | Status: AC
  Filled 2011-04-14: qty 20

## 2011-04-14 MED ORDER — LIDOCAINE HCL (PF) 0.5 % IJ SOLN
INTRAMUSCULAR | Status: AC
  Filled 2011-04-14: qty 50

## 2011-04-14 MED ORDER — MIDAZOLAM HCL 2 MG/2ML IJ SOLN
1.0000 mg | INTRAMUSCULAR | Status: DC | PRN
  Administered 2011-04-14 (×2): 2 mg via INTRAVENOUS

## 2011-04-14 MED ORDER — ONDANSETRON HCL 4 MG/2ML IJ SOLN
4.0000 mg | Freq: Once | INTRAMUSCULAR | Status: AC
  Administered 2011-04-14: 4 mg via INTRAVENOUS

## 2011-04-14 MED ORDER — OXYCODONE HCL 5 MG PO TABS
ORAL_TABLET | ORAL | Status: AC
  Administered 2011-04-14: 5 mg via ORAL
  Filled 2011-04-14: qty 1

## 2011-04-14 MED ORDER — HYDROCODONE-ACETAMINOPHEN 10-325 MG PO TABS: 1.0000 | ORAL_TABLET | Freq: Four times a day (QID) | ORAL | Status: AC | PRN

## 2011-04-14 MED ORDER — LACTATED RINGERS IV SOLN
INTRAVENOUS | Status: DC | PRN
  Administered 2011-04-14: 11:00:00 via INTRAVENOUS

## 2011-04-14 SURGICAL SUPPLY — 35 items
BAG HAMPER (MISCELLANEOUS) ×2 IMPLANT
BANDAGE ELASTIC 3 VELCRO NS (GAUZE/BANDAGES/DRESSINGS) ×2 IMPLANT
BANDAGE ESMARK 4X12 BL STRL LF (DISPOSABLE) ×1 IMPLANT
BANDAGE GAUZE ELAST BULKY 4 IN (GAUZE/BANDAGES/DRESSINGS) ×2 IMPLANT
BLADE SURG 15 STRL LF DISP TIS (BLADE) ×1 IMPLANT
BLADE SURG 15 STRL SS (BLADE) ×1
BNDG COHESIVE 4X5 TAN NS LF (GAUZE/BANDAGES/DRESSINGS) ×2 IMPLANT
BNDG ESMARK 4X12 BLUE STRL LF (DISPOSABLE) ×2
CHLORAPREP W/TINT 26ML (MISCELLANEOUS) ×2 IMPLANT
CLOTH BEACON ORANGE TIMEOUT ST (SAFETY) ×2 IMPLANT
COVER SURGICAL LIGHT HANDLE (MISCELLANEOUS) ×4 IMPLANT
CUFF TOURNIQUET SINGLE 18IN (TOURNIQUET CUFF) ×2 IMPLANT
DECANTER SPIKE VIAL GLASS SM (MISCELLANEOUS) ×2 IMPLANT
DRSG XEROFORM 1X8 (GAUZE/BANDAGES/DRESSINGS) ×2 IMPLANT
ELECT NEEDLE TIP 2.8 STRL (NEEDLE) ×2 IMPLANT
ELECT REM PT RETURN 9FT ADLT (ELECTROSURGICAL) ×2
ELECTRODE REM PT RTRN 9FT ADLT (ELECTROSURGICAL) ×1 IMPLANT
GLOVE BIOGEL PI IND STRL 7.0 (GLOVE) ×2 IMPLANT
GLOVE BIOGEL PI INDICATOR 7.0 (GLOVE) ×2
GLOVE ECLIPSE 6.5 STRL STRAW (GLOVE) ×4 IMPLANT
GLOVE SKINSENSE NS SZ8.0 LF (GLOVE) ×1
GLOVE SKINSENSE STRL SZ8.0 LF (GLOVE) ×1 IMPLANT
GLOVE SS N UNI LF 8.5 STRL (GLOVE) ×2 IMPLANT
GOWN STRL REIN XL XLG (GOWN DISPOSABLE) ×6 IMPLANT
HAND ALUMI XLG (SOFTGOODS) ×2 IMPLANT
KIT ROOM TURNOVER APOR (KITS) ×2 IMPLANT
MANIFOLD NEPTUNE II (INSTRUMENTS) ×2 IMPLANT
NEEDLE HYPO 21X1.5 SAFETY (NEEDLE) ×2 IMPLANT
NS IRRIG 1000ML POUR BTL (IV SOLUTION) ×2 IMPLANT
PACK BASIC LIMB (CUSTOM PROCEDURE TRAY) ×2 IMPLANT
PAD ARMBOARD 7.5X6 YLW CONV (MISCELLANEOUS) ×2 IMPLANT
SET BASIN LINEN APH (SET/KITS/TRAYS/PACK) ×2 IMPLANT
SPONGE GAUZE 4X4 12PLY (GAUZE/BANDAGES/DRESSINGS) ×2 IMPLANT
SUT ETHILON 3 0 FSL (SUTURE) ×2 IMPLANT
SYR 30ML LL (SYRINGE) ×2 IMPLANT

## 2011-04-14 NOTE — Interval H&P Note (Signed)
History and Physical Interval Note:  04/14/2011 10:50 AM  Melissa Webb  has presented today for surgery, with the diagnosis of carpal tunnel syndrome left wrist  The various methods of treatment have been discussed with the patient and family. After consideration of risks, benefits and other options for treatment, the patient has consented to  Procedure(s) (LRB): LEFT CARPAL TUNNEL RELEASE (Left) as a surgical intervention .  The patients' history has been reviewed, patient examined, no change in status, stable for surgery.  I have reviewed the patients' chart and labs.  Questions were answered to the patient's satisfaction.     Fuller Canada

## 2011-04-14 NOTE — Op Note (Addendum)
Operative note RIGHT carpal tunnel release date of surgery August 10, 2010.  Preop diagnosis carpal tunnel syndrome RIGHT wrist Postop diagnosis carpal tunnel syndrome LEFT wrist Procedure open LEFT carpal tunnel release Surgeon Romeo Apple Anesthetic regional Bier block Operative findings COMPRESSED MEDIAN NERVE IN  carpal tunnel Details of procedure: The patient was identified in the preop holding area and the surgical site was marked encounter signed.  The history and physical update was completed.  Antibiotics were ordered and the patient was given antibiotics and taken to surgery.  In the surgical suite a Bier block was done and then the LEFT arm was prepped and draped in sterile technique.  At this point the timeout procedure was initiated and completed.  The incision was made over the carpal tunnel and extended to the palmar fascia.  The palmar fascia was divided bluntly and dissection was carried out to the distal aspect of the transverse carpal ligament.  Blunt dissection was carried out beneath the ligament and sharp dissection was used to release the ligament.  The contents of the carpal tunnel were inspected and found to be free of any other pathology.  Irrigation was performed.  Closure was with RUNNING 3-0 nylon suture.  10 cc of plain Sensorcaine was injected around the incision edges.  A sterile bandage was applied.  The tourniquet was released the color of the fingers was normal and the capillary refill was normal.  The patient is transferred to Recovery area and will be discharged home.

## 2011-04-14 NOTE — Brief Op Note (Signed)
04/14/2011  11:43 AM  PATIENT:  Melissa Webb  61 y.o. female  PRE-OPERATIVE DIAGNOSIS:  carpal tunnel syndrome left wrist  POST-OPERATIVE DIAGNOSIS:  carpal tunnel syndrome left wrist  PROCEDURE:  Procedure(s) (LRB): CARPAL TUNNEL RELEASE (Left)  FINDINGS: MEDIAN NERVE COMPRESSION   SURGEON:  Surgeon(s) and Role:    * Vickki Hearing, MD - Primary  PHYSICIAN ASSISTANT:   ASSISTANTS: none   ANESTHESIA:   regional  EBL:  Total I/O In: 600 [I.V.:600] Out: 0   BLOOD ADMINISTERED:none  DRAINS: none   LOCAL MEDICATIONS USED:  MARCAINE   PLAIN 20 CC  SPECIMEN:  No Specimen  DISPOSITION OF SPECIMEN:  N/A  COUNTS:  YES  TOURNIQUET:   Total Tourniquet Time Documented: Upper Arm (Left) - 31 minutes  DICTATION: .Dragon Dictation  PLAN OF CARE: Discharge to home after PACU  PATIENT DISPOSITION:  PACU - hemodynamically stable.   Delay start of Pharmacological VTE agent (>24hrs) due to surgical blood loss or risk of bleeding: not applicable

## 2011-04-14 NOTE — Anesthesia Preprocedure Evaluation (Signed)
Anesthesia Evaluation  Patient identified by MRN, date of birth, ID band Patient awake    Reviewed: Allergy & Precautions, H&P , NPO status , Patient's Chart, lab work & pertinent test results  Airway Mallampati: I TM Distance: >3 FB Neck ROM: Full    Dental No notable dental hx.    Pulmonary    Pulmonary exam normal       Cardiovascular negative cardio ROS  Rhythm:Regular Rate:Normal     Neuro/Psych Anxiety  Neuromuscular disease    GI/Hepatic Neg liver ROS, GERD-  Medicated and Controlled,  Endo/Other  negative endocrine ROS  Renal/GU negative Renal ROS  negative genitourinary   Musculoskeletal negative musculoskeletal ROS (+)   Abdominal Normal abdominal exam  (+)   Peds  Hematology negative hematology ROS (+)   Anesthesia Other Findings   Reproductive/Obstetrics negative OB ROS                           Anesthesia Physical Anesthesia Plan  ASA: I  Anesthesia Plan: Bier Block   Post-op Pain Management:    Induction: Intravenous  Airway Management Planned: Nasal Cannula  Additional Equipment:   Intra-op Plan:   Post-operative Plan:   Informed Consent: I have reviewed the patients History and Physical, chart, labs and discussed the procedure including the risks, benefits and alternatives for the proposed anesthesia with the patient or authorized representative who has indicated his/her understanding and acceptance.     Plan Discussed with: CRNA  Anesthesia Plan Comments:         Anesthesia Quick Evaluation

## 2011-04-14 NOTE — Discharge Instructions (Signed)
Carpal Tunnel Release  Carpal tunnel release is done to relieve the pressure on the nerves and tendons on the bottom side of your wrist.   LET YOUR CAREGIVER KNOW ABOUT:    Allergies to food or medicine.   Medicines taken, including vitamins, herbs, eyedrops, over-the-counter medicines, and creams.   Use of steroids (by mouth or creams).   Previous problems with anesthetics or numbing medicines.   History of bleeding problems or blood clots.   Previous surgery.   Other health problems, including diabetes and kidney problems.   Possibility of pregnancy, if this applies.  RISKS AND COMPLICATIONS   Some problems that may happen after this procedure include:   Infection.   Damage to the nerves, arteries or tendons could occur. This would be very uncommon.   Bleeding.  BEFORE THE PROCEDURE    This surgery may be done while you are asleep (general anesthetic) or may be done under a block where only your forearm and the surgical area is numb.   If the surgery is done under a block, the numbness will gradually wear off within several hours after surgery.  HOME CARE INSTRUCTIONS    Have a responsible person with you for 24 hours.   Do not drive a car or use public transportation for 24 hours.   Only take over-the-counter or prescription medicines for pain, discomfort, or fever as directed by your caregiver. Take them as directed.   You may put ice on the palm side of the affected wrist.   Put ice in a plastic bag.   Place a towel between your skin and the bag.   Leave the ice on for 20 to 30 minutes, 4 times per day.   If you were given a splint to keep your wrist from bending, use it as directed. It is important to wear the splint at night or as directed. Use the splint for as long as you have pain or numbness in your hand, arm, or wrist. This may take 1 to 2 months.   Keep your hand raised (elevated) above the level of your heart as much as possible. This keeps swelling down and helps with  discomfort.   Change bandages (dressings) as directed.   Keep the wound clean and dry.  SEEK MEDICAL CARE IF:    You develop pain not relieved with medications.   You develop numbness of your hand.   You develop bleeding from your surgical site.   You have an oral temperature above 102 F (38.9 C).   You develop redness or swelling of the surgical site.   You develop new, unexplained problems.  SEEK IMMEDIATE MEDICAL CARE IF:    You develop a rash.   You have difficulty breathing.   You develop any reaction or side effects to medications given.  Document Released: 04/01/2003 Document Revised: 12/29/2010 Document Reviewed: 11/15/2006  [employer] Patient Information 2012 [employer].

## 2011-04-14 NOTE — Anesthesia Postprocedure Evaluation (Signed)
  Anesthesia Post-op Note  Patient: Melissa Webb  Procedure(s) Performed: Procedure(s) (LRB): CARPAL TUNNEL RELEASE (Left)  Patient Location: PACU  Anesthesia Type: Bier block  Level of Consciousness: awake, alert , oriented and patient cooperative  Airway and Oxygen Therapy: Patient Spontanous Breathing  Post-op Pain: none  Post-op Assessment: Post-op Vital signs reviewed, Patient's Cardiovascular Status Stable, Respiratory Function Stable, Patent Airway and No signs of Nausea or vomiting  Post-op Vital Signs: Reviewed and stable  Complications: No apparent anesthesia complications

## 2011-04-14 NOTE — Preoperative (Signed)
Beta Blockers   Reason not to administer Beta Blockers:Not Applicable 

## 2011-04-14 NOTE — Transfer of Care (Signed)
Immediate Anesthesia Transfer of Care Note  Patient: Melissa Webb  Procedure(s) Performed: Procedure(s) (LRB): CARPAL TUNNEL RELEASE (Left)  Patient Location: PACU  Anesthesia Type: Bier block  Level of Consciousness: awake, alert , oriented and patient cooperative  Airway & Oxygen Therapy: Patient Spontanous Breathing  Post-op Assessment: Report given to PACU RN and Post -op Vital signs reviewed and stable  Post vital signs: Reviewed and stable  Complications: No apparent anesthesia complications

## 2011-04-14 NOTE — Anesthesia Procedure Notes (Signed)
Anesthesia Regional Block:  Bier block (IV Regional)  Pre-Anesthetic Checklist: ,, timeout performed, Correct Patient, Correct Site, Correct Laterality, Correct Procedure,, site marked, surgical consent,, at surgeon's request Needles:  Injection technique: Single-shot  Needle Type: Other      Needle Gauge: 22 and 22 G    Additional Needles: Bier block (IV Regional)  Nerve Stimulator or Paresthesia:   Additional Responses:  Pulse checked post tourniquet inflation. IV NSL discontinued post injection. Narrative:  Start time: 04/14/2011 11:09 AM  Performed by: Personally   Additional Notes: LIdocaine .5% 50 cc injected at 1109

## 2011-04-17 ENCOUNTER — Ambulatory Visit (INDEPENDENT_AMBULATORY_CARE_PROVIDER_SITE_OTHER): Payer: Medicaid Other | Admitting: Orthopedic Surgery

## 2011-04-17 ENCOUNTER — Encounter: Payer: Self-pay | Admitting: Orthopedic Surgery

## 2011-04-17 ENCOUNTER — Telehealth: Payer: Self-pay | Admitting: Orthopedic Surgery

## 2011-04-17 VITALS — BP 100/68 | Ht 62.0 in | Wt 167.0 lb

## 2011-04-17 DIAGNOSIS — G5600 Carpal tunnel syndrome, unspecified upper limb: Secondary | ICD-10-CM

## 2011-04-17 NOTE — Telephone Encounter (Signed)
Called Dr. Sanjuan Dame office regarding Dr. Mort Sawyers office note today, 04/17/11, and scheduled suture removal for 04/26/11 at 9:45am, due to Dr. Romeo Apple out of office next week. Spoke with Gavin Pound.  Office note and operative note faxed to [phone].  Patient aware of appointment.

## 2011-04-17 NOTE — Progress Notes (Signed)
Patient ID: Melissa Webb, female   DOB: 22-May-1950, 61 y.o.   MRN: [id number] Chief Complaint  Patient presents with  . Follow-up    Post op  recheck on left CTR.   Incision clean  No drainage   Return in 3 weeks   Will see Dr Hilda Lias next week for suture removal

## 2011-04-17 NOTE — Patient Instructions (Signed)
Sutures will be removed at Dr Hilda Lias s office on wed or Thursday   Take 2 tylenol pm at night for sleep

## 2011-04-18 ENCOUNTER — Encounter (HOSPITAL_COMMUNITY): Payer: Self-pay | Admitting: Orthopedic Surgery

## 2011-05-08 ENCOUNTER — Other Ambulatory Visit: Payer: Self-pay | Admitting: *Deleted

## 2011-05-08 ENCOUNTER — Encounter: Payer: Self-pay | Admitting: Orthopedic Surgery

## 2011-05-08 ENCOUNTER — Ambulatory Visit (INDEPENDENT_AMBULATORY_CARE_PROVIDER_SITE_OTHER): Payer: Medicaid Other | Admitting: Orthopedic Surgery

## 2011-05-08 VITALS — BP 90/68 | Ht 62.0 in | Wt 167.0 lb

## 2011-05-08 DIAGNOSIS — Z9889 Other specified postprocedural states: Secondary | ICD-10-CM

## 2011-05-08 MED ORDER — HYDROCODONE-ACETAMINOPHEN 10-325 MG PO TABS: 1.0000 | ORAL_TABLET | ORAL | Status: DC | PRN

## 2011-05-08 NOTE — Progress Notes (Signed)
Patient ID: Melissa Webb, female   DOB: Aug 03, 1950, 61 y.o.   MRN: [id number] Chief Complaint  Patient presents with  . Follow-up    3 week recheck left carpal tunnel DOS 04/14/11    The patient had the sutures removed in my absence at local orthopaedist office and some of the sutures were LEFT in I was able to dig one half of the suture out but an obvious trapped under the skin and will require surgical removal.  She says that when she holds her arm down she gets electric sensation running up the arm  Most likely has either superficial median nerve repeat median nerve entrapment and scar or suture  Recommended reopening the wound and removing the sutures and evaluating the carpal tunnel release.

## 2011-05-09 NOTE — Patient Instructions (Addendum)
20 Melissa Webb  05/09/2011   Your procedure is scheduled on:   05/12/2011  Report to [facility] at 1015  AM.  Call this number if you have problems the morning of surgery: [phone]   Remember:   Do not eat food:After Midnight.  May have clear liquids:until Midnight .  Clear liquids include soda, tea, black coffee, apple or grape juice, broth.  Take these medicines the morning of surgery with A SIP OF WATER: xanax,hydrocodone,atarax   Do not wear jewelry, make-up or nail polish.  Do not wear lotions, powders, or perfumes. You may wear deodorant.  Do not shave 48 hours prior to surgery.  Do not bring valuables to the hospital.  Contacts, dentures or bridgework may not be worn into surgery.  Leave suitcase in the car. After surgery it may be brought to your room.  For patients admitted to the hospital, checkout time is 11:00 AM the day of discharge.   Patients discharged the day of surgery will not be allowed to drive home.  Name and phone number of your driver: family  Special Instructions: CHG Shower Use Special Wash: 1/2 bottle night before surgery and 1/2 bottle morning of surgery. and N/A   Please read over the following fact sheets that you were given: Pain Booklet, MRSA Information, Surgical Site Infection Prevention, Anesthesia Post-op Instructions and Care and Recovery After Surgery PATIENT INSTRUCTIONS POST-ANESTHESIA  IMMEDIATELY FOLLOWING SURGERY:  Do not drive or operate machinery for the first twenty four hours after surgery.  Do not make any important decisions for twenty four hours after surgery or while taking narcotic pain medications or sedatives.  If you develop intractable nausea and vomiting or a severe headache please notify your doctor immediately.  FOLLOW-UP:  Please make an appointment with your surgeon as instructed. You do not need to follow up with anesthesia unless specifically instructed to do so.  WOUND CARE INSTRUCTIONS (if applicable):  Keep a dry clean  dressing on the anesthesia/puncture wound site if there is drainage.  Once the wound has quit draining you may leave it open to air.  Generally you should leave the bandage intact for twenty four hours unless there is drainage.  If the epidural site drains for more than 36-48 hours please call the anesthesia department.  QUESTIONS?:  Please feel free to call your physician or the hospital operator if you have any questions, and they will be happy to assist you.     [facility] Anesthesia Department [address] [place] [phone]

## 2011-05-10 ENCOUNTER — Encounter (HOSPITAL_COMMUNITY): Payer: Self-pay

## 2011-05-10 ENCOUNTER — Encounter (HOSPITAL_COMMUNITY)
Admission: RE | Admit: 2011-05-10 | Discharge: 2011-05-10 | Disposition: A | Discharge status: Home / Self Care | Payer: Medicaid Other | Source: Ambulatory Visit | Attending: Orthopedic Surgery | Admitting: Orthopedic Surgery

## 2011-05-11 ENCOUNTER — Telehealth: Payer: Self-pay | Admitting: Orthopedic Surgery

## 2011-05-11 NOTE — H&P (Signed)
Melissa Webb is an 61 y.o. female.   Chief Complaint: PAIN LEFT HAND  HPI: S/P LEFT CTR, SUTURES WERE REMOVE SEVERAL WERE RETAINED  PAIN NUMBNESS NAD TINGLING WERE NOTICED AFTER SUTURE REMOVAL   Past Medical History  Diagnosis Date  . GERD (gastroesophageal reflux disease)   . Anxiety   . Arthritis     osteoarthritis    Past Surgical History  Procedure Date  . Appendectomy   . Cholecystectomy   . Tubal ligation   . Carpal tunnel release 02/24/2011    Procedure: CARPAL TUNNEL RELEASE;  Surgeon: Fuller Canada, MD;  Location: [facility];  Service: Orthopedics;  Laterality: Right;  . Carpal tunnel release 04/14/2011    Procedure: CARPAL TUNNEL RELEASE;  Surgeon: Vickki Hearing, MD;  Location: [facility];  Service: Orthopedics;  Laterality: Left;    Family History  Problem Relation Age of Onset  . Heart disease    . Arthritis    . Cancer    . Anesthesia problems Neg Hx   . Hypotension Neg Hx   . Malignant hyperthermia Neg Hx   . Pseudochol deficiency Neg Hx    Social History:  reports that she quit smoking about 21 years ago. Her smoking use included Cigarettes. She has a 2.5 pack-year smoking history. She does not have any smokeless tobacco history on file. She reports that she does not drink alcohol or use illicit drugs.  Allergies: No Known Allergies  No current facility-administered medications on file as of .   Medications Prior to Admission  Medication Sig Dispense Refill  . alprazolam (XANAX) 2 MG tablet Take 2 mg by mouth 2 (two) times daily as needed. For anxiety      . aspirin EC 81 MG tablet Take 81 mg by mouth daily.      . diphenhydramine-acetaminophen (TYLENOL PM) 25-500 MG TABS Take 1 tablet by mouth at bedtime as needed. Sleep/Pain      . doxycycline (VIBRA-TABS) 100 MG tablet Take 100 mg by mouth 2 (two) times daily.        Results for orders placed during the hospital encounter of 05/10/11 (from the past 48 hour(s))  SURGICAL PCR SCREEN     Status: Normal   Collection Time   05/10/11 11:14 AM      Component Value Range Comment   MRSA, PCR NEGATIVE  NEGATIVE     Staphylococcus aureus NEGATIVE  NEGATIVE     No results found.  Review of Systems  Musculoskeletal: Positive for myalgias and joint pain.  Neurological: Positive for tingling.  All other systems reviewed and are negative.    There were no vitals taken for this visit. Physical Exam  Musculoskeletal:       Vital signs are stable as recorded  General appearance is normal  The patient is alert and oriented x3  The patient's mood and affect are normal  Gait assessment: NORMAL  The cardiovascular exam reveals normal pulses and temperature without edema swelling.  The lymphatic system is negative for palpable lymph nodes  The sensory exam is normal.  There are no pathologic reflexes.  Balance is normal.   Exam of the LEFT WRIST  Inspection THERE ARE RETAINED SUTURES PAIN TENDERNESS AND ERYTHEMA ; A PORTION OF THE WOUND HAS OPENED  Range of motion NORMAL WRIST FLEXION Stability NORMAL  Strength DECREASED HAND GRIP Skin SEE ABOVE   Lower extremity exam  Ambulation is normal.  Inspection and palpation revealed no tenderness or abnormality in alignment in  the lower extremities. Range of motion is full.  Strength is grade 5.   all joints are stable.         Assessment/Plan RETAINED SUTUTRES POSSIBLE RECURRENT CTS LEFT WRIST   REMOVE FOREIGN BODIES/SUTURES AND EXPLORE MEDIAN NERVE   Fuller Canada 05/11/2011, 7:52 AM

## 2011-05-11 NOTE — Telephone Encounter (Addendum)
Contacted Medicaid's third party insurance contact, [employer], ph [phone], regarding out-patient surgery scheduled at [facility], 05/12/11, CPT [phone], and requested representative, therefore routed to ph# [phone].  Spoke with Laury Axon, who states that no pre-authorization is required.

## 2011-05-12 ENCOUNTER — Encounter (HOSPITAL_COMMUNITY)
Admission: RE | Disposition: A | Discharge status: Home / Self Care | Payer: Self-pay | Source: Ambulatory Visit | Attending: Orthopedic Surgery

## 2011-05-12 ENCOUNTER — Encounter (HOSPITAL_COMMUNITY): Payer: Self-pay | Admitting: *Deleted

## 2011-05-12 ENCOUNTER — Encounter (HOSPITAL_COMMUNITY): Payer: Self-pay | Admitting: Anesthesiology

## 2011-05-12 ENCOUNTER — Ambulatory Visit (HOSPITAL_COMMUNITY)
Admission: RE | Admit: 2011-05-12 | Discharge: 2011-05-12 | Disposition: A | Discharge status: Home / Self Care | Payer: Medicaid Other | Source: Ambulatory Visit | Attending: Orthopedic Surgery | Admitting: Orthopedic Surgery

## 2011-05-12 ENCOUNTER — Ambulatory Visit (HOSPITAL_COMMUNITY): Payer: Medicaid Other | Admitting: Anesthesiology

## 2011-05-12 DIAGNOSIS — Y838 Other surgical procedures as the cause of abnormal reaction of the patient, or of later complication, without mention of misadventure at the time of the procedure: Secondary | ICD-10-CM | POA: Insufficient documentation

## 2011-05-12 DIAGNOSIS — G5600 Carpal tunnel syndrome, unspecified upper limb: Secondary | ICD-10-CM

## 2011-05-12 DIAGNOSIS — L923 Foreign body granuloma of the skin and subcutaneous tissue: Secondary | ICD-10-CM

## 2011-05-12 DIAGNOSIS — IMO0002 Reserved for concepts with insufficient information to code with codable children: Secondary | ICD-10-CM | POA: Insufficient documentation

## 2011-05-12 HISTORY — PX: FOREIGN BODY REMOVAL: SHX962

## 2011-05-12 SURGERY — REMOVAL FOREIGN BODY EXTREMITY
Anesthesia: Regional | Site: Wrist | Laterality: Left | Wound class: Clean

## 2011-05-12 MED ORDER — CEFAZOLIN SODIUM 1-5 GM-% IV SOLN
INTRAVENOUS | Status: AC
  Filled 2011-05-12: qty 50

## 2011-05-12 MED ORDER — GLYCOPYRROLATE 0.2 MG/ML IJ SOLN
INTRAMUSCULAR | Status: AC
  Administered 2011-05-12: 0.2 mg via INTRAVENOUS
  Filled 2011-05-12: qty 1

## 2011-05-12 MED ORDER — FENTANYL CITRATE 0.05 MG/ML IJ SOLN
25.0000 ug | INTRAMUSCULAR | Status: DC | PRN
  Administered 2011-05-12 (×3): 50 ug via INTRAVENOUS

## 2011-05-12 MED ORDER — HYDROCODONE-ACETAMINOPHEN 10-325 MG PO TABS: 1.0000 | ORAL_TABLET | ORAL | Status: AC | PRN

## 2011-05-12 MED ORDER — MIDAZOLAM HCL 5 MG/5ML IJ SOLN
INTRAMUSCULAR | Status: DC | PRN
  Administered 2011-05-12: 2 mg via INTRAVENOUS

## 2011-05-12 MED ORDER — FENTANYL CITRATE 0.05 MG/ML IJ SOLN
INTRAMUSCULAR | Status: DC | PRN
  Administered 2011-05-12 (×2): 50 ug via INTRAVENOUS

## 2011-05-12 MED ORDER — ONDANSETRON HCL 4 MG/2ML IJ SOLN: 4.0000 mg | Freq: Once | INTRAMUSCULAR | Status: DC

## 2011-05-12 MED ORDER — FENTANYL CITRATE 0.05 MG/ML IJ SOLN
INTRAMUSCULAR | Status: AC
  Administered 2011-05-12: 50 ug via INTRAVENOUS
  Filled 2011-05-12: qty 2

## 2011-05-12 MED ORDER — ONDANSETRON HCL 4 MG/2ML IJ SOLN: 4.0000 mg | Freq: Once | INTRAMUSCULAR | Status: DC | PRN

## 2011-05-12 MED ORDER — ACETAMINOPHEN 325 MG PO TABS: 325.0000 mg | ORAL_TABLET | ORAL | Status: DC | PRN

## 2011-05-12 MED ORDER — PROPOFOL 10 MG/ML IV EMUL
INTRAVENOUS | Status: AC
  Filled 2011-05-12: qty 20

## 2011-05-12 MED ORDER — CEFAZOLIN SODIUM 1-5 GM-% IV SOLN: 1.0000 g | INTRAVENOUS | Status: DC

## 2011-05-12 MED ORDER — LACTATED RINGERS IV SOLN
INTRAVENOUS | Status: DC | PRN
  Administered 2011-05-12: 11:00:00 via INTRAVENOUS

## 2011-05-12 MED ORDER — PROPOFOL 10 MG/ML IV EMUL
INTRAVENOUS | Status: DC | PRN
  Administered 2011-05-12: 75 ug/kg/min via INTRAVENOUS

## 2011-05-12 MED ORDER — MIDAZOLAM HCL 2 MG/2ML IJ SOLN
INTRAMUSCULAR | Status: AC
  Administered 2011-05-12: 2 mg via INTRAVENOUS
  Filled 2011-05-12: qty 2

## 2011-05-12 MED ORDER — OXYCODONE HCL 5 MG PO TABS
5.0000 mg | ORAL_TABLET | ORAL | Status: DC
  Administered 2011-05-12: 5 mg via ORAL

## 2011-05-12 MED ORDER — 0.9 % SODIUM CHLORIDE (POUR BTL) OPTIME
TOPICAL | Status: DC | PRN
  Administered 2011-05-12: 1000 mL

## 2011-05-12 MED ORDER — ACETAMINOPHEN 10 MG/ML IV SOLN
1000.0000 mg | Freq: Once | INTRAVENOUS | Status: AC
  Administered 2011-05-12: 1000 mg via INTRAVENOUS

## 2011-05-12 MED ORDER — MIDAZOLAM HCL 2 MG/2ML IJ SOLN
1.0000 mg | INTRAMUSCULAR | Status: DC | PRN
  Administered 2011-05-12 (×2): 2 mg via INTRAVENOUS

## 2011-05-12 MED ORDER — FENTANYL CITRATE 0.05 MG/ML IJ SOLN
INTRAMUSCULAR | Status: AC
  Filled 2011-05-12: qty 2

## 2011-05-12 MED ORDER — GLYCOPYRROLATE 0.2 MG/ML IJ SOLN
0.2000 mg | Freq: Once | INTRAMUSCULAR | Status: AC
  Administered 2011-05-12: 0.2 mg via INTRAVENOUS

## 2011-05-12 MED ORDER — BUPIVACAINE HCL (PF) 0.5 % IJ SOLN
INTRAMUSCULAR | Status: DC | PRN
  Administered 2011-05-12: 17 mL

## 2011-05-12 MED ORDER — LIDOCAINE HCL (PF) 1 % IJ SOLN
INTRAMUSCULAR | Status: AC
  Filled 2011-05-12: qty 5

## 2011-05-12 MED ORDER — ACETAMINOPHEN 10 MG/ML IV SOLN
INTRAVENOUS | Status: AC
  Administered 2011-05-12: 1000 mg via INTRAVENOUS
  Filled 2011-05-12: qty 100

## 2011-05-12 MED ORDER — SODIUM CHLORIDE 0.9 % IJ SOLN
INTRAMUSCULAR | Status: AC
  Filled 2011-05-12: qty 10

## 2011-05-12 MED ORDER — LIDOCAINE HCL (PF) 0.5 % IJ SOLN
INTRAMUSCULAR | Status: AC
  Filled 2011-05-12: qty 50

## 2011-05-12 MED ORDER — BUPIVACAINE HCL (PF) 0.5 % IJ SOLN
INTRAMUSCULAR | Status: AC
  Filled 2011-05-12: qty 30

## 2011-05-12 MED ORDER — CHLORHEXIDINE GLUCONATE 4 % EX LIQD
60.0000 mL | Freq: Once | CUTANEOUS | Status: DC
  Filled 2011-05-12: qty 60

## 2011-05-12 MED ORDER — LIDOCAINE HCL (CARDIAC) 10 MG/ML IV SOLN
INTRAVENOUS | Status: DC | PRN
  Administered 2011-05-12: 50 mg via INTRAVENOUS

## 2011-05-12 MED ORDER — LACTATED RINGERS IV SOLN
INTRAVENOUS | Status: DC
  Administered 2011-05-12: 1000 mL via INTRAVENOUS

## 2011-05-12 MED ORDER — OXYCODONE HCL 5 MG PO TABS
ORAL_TABLET | ORAL | Status: AC
  Filled 2011-05-12: qty 1

## 2011-05-12 MED ORDER — MIDAZOLAM HCL 2 MG/2ML IJ SOLN
INTRAMUSCULAR | Status: AC
  Filled 2011-05-12: qty 2

## 2011-05-12 SURGICAL SUPPLY — 38 items
BAG HAMPER (MISCELLANEOUS) ×2 IMPLANT
BANDAGE ELASTIC 3 VELCRO NS (GAUZE/BANDAGES/DRESSINGS) ×2 IMPLANT
BANDAGE ELASTIC 3 VELCRO ST LF (GAUZE/BANDAGES/DRESSINGS) ×2 IMPLANT
BANDAGE ELASTIC 4 VELCRO NS (GAUZE/BANDAGES/DRESSINGS) ×2 IMPLANT
BANDAGE ESMARK 4X12 BL STRL LF (DISPOSABLE) ×1 IMPLANT
BANDAGE GAUZE ELAST BULKY 4 IN (GAUZE/BANDAGES/DRESSINGS) ×2 IMPLANT
BNDG COHESIVE 4X5 TAN NS LF (GAUZE/BANDAGES/DRESSINGS) IMPLANT
BNDG ESMARK 4X12 BLUE STRL LF (DISPOSABLE) ×2
CHLORAPREP W/TINT 26ML (MISCELLANEOUS) ×2 IMPLANT
CLOTH BEACON ORANGE TIMEOUT ST (SAFETY) ×2 IMPLANT
COVER SURGICAL LIGHT HANDLE (MISCELLANEOUS) ×4 IMPLANT
CUFF TOURNIQUET SINGLE 18IN (TOURNIQUET CUFF) ×2 IMPLANT
DECANTER SPIKE VIAL GLASS SM (MISCELLANEOUS) IMPLANT
DRSG XEROFORM 1X8 (GAUZE/BANDAGES/DRESSINGS) ×2 IMPLANT
ELECT REM PT RETURN 9FT ADLT (ELECTROSURGICAL) ×2
ELECTRODE REM PT RTRN 9FT ADLT (ELECTROSURGICAL) ×1 IMPLANT
GLOVE SKINSENSE NS SZ8.0 LF (GLOVE) ×1
GLOVE SKINSENSE STRL SZ8.0 LF (GLOVE) ×1 IMPLANT
GLOVE SS N UNI LF 8.5 STRL (GLOVE) ×2 IMPLANT
GOWN STRL REIN XL XLG (GOWN DISPOSABLE) ×6 IMPLANT
HAND ALUMI XLG (SOFTGOODS) ×2 IMPLANT
KIT ROOM TURNOVER APOR (KITS) ×2 IMPLANT
MANIFOLD NEPTUNE II (INSTRUMENTS) ×2 IMPLANT
NS IRRIG 1000ML POUR BTL (IV SOLUTION) ×2 IMPLANT
PACK BASIC LIMB (CUSTOM PROCEDURE TRAY) ×2 IMPLANT
PAD ARMBOARD 7.5X6 YLW CONV (MISCELLANEOUS) ×2 IMPLANT
PAD CAST 3X4 CTTN HI CHSV (CAST SUPPLIES) ×1 IMPLANT
PAD CAST 4YDX4 CTTN HI CHSV (CAST SUPPLIES) ×1 IMPLANT
PADDING CAST COTTON 3X4 STRL (CAST SUPPLIES) ×1
PADDING CAST COTTON 4X4 STRL (CAST SUPPLIES) ×1
SET BASIN LINEN APH (SET/KITS/TRAYS/PACK) ×2 IMPLANT
SOL PREP PROV IODINE SCRUB 4OZ (MISCELLANEOUS) IMPLANT
SPONGE GAUZE 4X4 12PLY (GAUZE/BANDAGES/DRESSINGS) ×2 IMPLANT
SUT ETHILON 3 0 FSL (SUTURE) ×2 IMPLANT
SWAB CULTURE LIQ STUART DBL (MISCELLANEOUS) IMPLANT
SYR 50ML LL SCALE MARK (SYRINGE) ×2 IMPLANT
SYR BULB IRRIGATION 50ML (SYRINGE) ×2 IMPLANT
TUBE ANAEROBIC PORT A CUL  W/M (MISCELLANEOUS) IMPLANT

## 2011-05-12 NOTE — Brief Op Note (Deleted)
05/12/2011  1:05 PM  PATIENT:  Melissa Webb  61 y.o. female  PRE-OPERATIVE DIAGNOSIS:  retained sutures left wrist  POST-OPERATIVE DIAGNOSIS:  retained sutures left wrist  PROCEDURE:  Procedure(s) (LRB): REMOVAL FOREIGN BODY EXTREMITY (Left) And exploration wrist   FINDINGS: 3 - 0 NYLON SUTURE; TIGHT CARPAL TUNNEL   SURGEON:  Surgeon(s) and Role:    * Vickki Hearing, MD - Primary  PHYSICIAN ASSISTANT:   ASSISTANTS: none   ANESTHESIA:   regional  EBL:  Total I/O In: 500 [I.V.:500] Out: -   BLOOD ADMINISTERED:none  DRAINS: none   LOCAL MEDICATIONS USED:  MARCAINE 17 CC (PLAIN)    SPECIMEN:  No Specimen  DISPOSITION OF SPECIMEN:  N/A  COUNTS:  YES  TOURNIQUET:   Total Tourniquet Time Documented: Upper Arm (Left) - 28 minutes  DICTATION: .Dragon Dictation  PLAN OF CARE: Discharge to home after PACU  PATIENT DISPOSITION:  PACU - hemodynamically stable.   Delay start of Pharmacological VTE agent (>24hrs) due to surgical blood loss or risk of bleeding: yes

## 2011-05-12 NOTE — Discharge Instructions (Signed)
Carpal Tunnel Release  Carpal tunnel release is done to relieve the pressure on the nerves and tendons on the bottom side of your wrist.   LET YOUR CAREGIVER KNOW ABOUT:    Allergies to food or medicine.   Medicines taken, including vitamins, herbs, eyedrops, over-the-counter medicines, and creams.   Use of steroids (by mouth or creams).   Previous problems with anesthetics or numbing medicines.   History of bleeding problems or blood clots.   Previous surgery.   Other health problems, including diabetes and kidney problems.   Possibility of pregnancy, if this applies.  RISKS AND COMPLICATIONS   Some problems that may happen after this procedure include:   Infection.   Damage to the nerves, arteries or tendons could occur. This would be very uncommon.   Bleeding.  BEFORE THE PROCEDURE    This surgery may be done while you are asleep (general anesthetic) or may be done under a block where only your forearm and the surgical area is numb.   If the surgery is done under a block, the numbness will gradually wear off within several hours after surgery.  HOME CARE INSTRUCTIONS    Have a responsible person with you for 24 hours.   Do not drive a car or use public transportation for 24 hours.   Only take over-the-counter or prescription medicines for pain, discomfort, or fever as directed by your caregiver. Take them as directed.   You may put ice on the palm side of the affected wrist.   Put ice in a plastic bag.   Place a towel between your skin and the bag.   Leave the ice on for 20 to 30 minutes, 4 times per day.   If you were given a splint to keep your wrist from bending, use it as directed. It is important to wear the splint at night or as directed. Use the splint for as long as you have pain or numbness in your hand, arm, or wrist. This may take 1 to 2 months.   Keep your hand raised (elevated) above the level of your heart as much as possible. This keeps swelling down and helps with  discomfort.   Change bandages (dressings) as directed.   Keep the wound clean and dry.  SEEK MEDICAL CARE IF:    You develop pain not relieved with medications.   You develop numbness of your hand.   You develop bleeding from your surgical site.   You have an oral temperature above 102 F (38.9 C).   You develop redness or swelling of the surgical site.   You develop new, unexplained problems.  SEEK IMMEDIATE MEDICAL CARE IF:    You develop a rash.   You have difficulty breathing.   You develop any reaction or side effects to medications given.  Document Released: 04/01/2003 Document Revised: 12/29/2010 Document Reviewed: 11/15/2006  [employer] Patient Information 2012 [employer].

## 2011-05-12 NOTE — Anesthesia Preprocedure Evaluation (Signed)
Anesthesia Evaluation  Patient identified by MRN, date of birth, ID band Patient awake    Reviewed: Allergy & Precautions, H&P , NPO status , Patient's Chart, lab work & pertinent test results  Airway Mallampati: I TM Distance: >3 FB Neck ROM: Full    Dental No notable dental hx.    Pulmonary    Pulmonary exam normal       Cardiovascular negative cardio ROS  Rhythm:Regular Rate:Normal     Neuro/Psych Anxiety  Neuromuscular disease    GI/Hepatic Neg liver ROS, GERD-  Medicated and Controlled,  Endo/Other  negative endocrine ROS  Renal/GU negative Renal ROS  negative genitourinary   Musculoskeletal negative musculoskeletal ROS (+)   Abdominal Normal abdominal exam  (+)   Peds  Hematology negative hematology ROS (+)   Anesthesia Other Findings   Reproductive/Obstetrics negative OB ROS                           Anesthesia Physical Anesthesia Plan  ASA: II  Anesthesia Plan: Bier Block   Post-op Pain Management:    Induction: Intravenous  Airway Management Planned: Nasal Cannula  Additional Equipment:   Intra-op Plan:   Post-operative Plan:   Informed Consent: I have reviewed the patients History and Physical, chart, labs and discussed the procedure including the risks, benefits and alternatives for the proposed anesthesia with the patient or authorized representative who has indicated his/her understanding and acceptance.     Plan Discussed with: CRNA  Anesthesia Plan Comments:         Anesthesia Quick Evaluation

## 2011-05-12 NOTE — Transfer of Care (Signed)
Immediate Anesthesia Transfer of Care Note  Patient: Melissa Webb  Procedure(s) Performed: Procedure(s) (LRB): REMOVAL FOREIGN BODY EXTREMITY (Left)  Patient Location: PACU  Anesthesia Type: Regional  Level of Consciousness: awake, alert , oriented and patient cooperative  Airway & Oxygen Therapy: Patient Spontanous Breathing and Patient connected to face mask oxygen  Post-op Assessment: Report given to PACU RN and Post -op Vital signs reviewed and stable  Post vital signs: Reviewed and stable  Complications: No apparent anesthesia complications

## 2011-05-12 NOTE — Anesthesia Procedure Notes (Signed)
Anesthesia Regional Block:  Bier block (IV Regional)  Pre-Anesthetic Checklist: ,, timeout performed, Correct Patient, Correct Site, Correct Laterality, Correct Procedure,, site marked, surgical consent,, at surgeon's request Needles:  Injection technique: Single-shot  Needle Type: Other   (22 ga IV cath)        Additional Needles: Bier block (IV Regional)  Nerve Stimulator or Paresthesia:   Additional Responses:  Pulse checked post tourniquet inflation. IV NSL discontinued post injection. .5% Lidocaine (preservative free) 50 cc injected Narrative:   Performed by: Personally

## 2011-05-12 NOTE — Interval H&P Note (Signed)
History and Physical Interval Note:  05/12/2011 12:13 PM  Melissa Webb  has presented today for surgery, with the diagnosis of retained sutures left wrist  The various methods of treatment have been discussed with the patient and family. After consideration of risks, benefits and other options for treatment, the patient has consented to  Procedure(s) (LRB): REMOVAL FOREIGN BODY EXTREMITY (Left) as a surgical intervention .  The patients' history has been reviewed, patient examined, no change in status, stable for surgery.  I have reviewed the patients' chart and labs.  Questions were answered to the patient's satisfaction.     Fuller Canada

## 2011-05-12 NOTE — Brief Op Note (Addendum)
05/12/2011  1:09 PM  PATIENT:  Melissa Webb  61 y.o. female  PRE-OPERATIVE DIAGNOSIS:  retained sutures left wrist  POST-OPERATIVE DIAGNOSIS:  retained sutures left wrist  PROCEDURE:  Procedure(s) (LRB): REMOVAL FOREIGN BODY EXTREMITY (Left) EXPLORATION LEFT WRIST   FINDINGS: 3-0 NYLON SUTURE AND TIGHT CARPAL TUNNEL   SURGEON:  Surgeon(s) and Role:    * Vickki Hearing, MD - Primary  PHYSICIAN ASSISTANT:   ASSISTANTS: none   ANESTHESIA:   regional  EBL:  Total I/O In: 500 [I.V.:500] Out: -   BLOOD ADMINISTERED:none  DRAINS: NO   LOCAL MEDICATIONS USED:  MARCAINE   17CC  SPECIMEN:  No Specimen  DISPOSITION OF SPECIMEN:  N/A  COUNTS:  YES  TOURNIQUET:   Total Tourniquet Time Documented: Upper Arm (Left) - 28 minutes  DICTATION: .Dragon Dictation  PLAN OF CARE: Discharge to home after PACU  PATIENT DISPOSITION:  PACU - hemodynamically stable.   Delay start of Pharmacological VTE agent (>24hrs) due to surgical blood loss or risk of bleeding: not applicable

## 2011-05-12 NOTE — Preoperative (Signed)
Beta Blockers   Reason not to administer Beta Blockers:Not Applicable 

## 2011-05-12 NOTE — Op Note (Signed)
05/12/2011  1:09 PM  PATIENT:  Melissa Webb  61 y.o. female  PRE-OPERATIVE DIAGNOSIS:  retained sutures left wrist  POST-OPERATIVE DIAGNOSIS:  retained sutures left wrist  PROCEDURE:  Procedure(s) (LRB): REMOVAL FOREIGN BODY EXTREMITY (Left) EXPLORATION LEFT WRIST   FINDINGS: 3-0 NYLON SUTURE AND TIGHT CARPAL TUNNEL   Details of procedure  The patient was identified in the preop area and the surgical site was marked after confirmation with the patient  The chart was reviewed and updated.  The patient was taken to the operating room given a gram of Ancef and she was given a Bier block. After adequate anesthesia. The previous incision was opened immediately noted was retained 3-0 nylon suture, granulation tissue. The carpal tunnel was then opened. It was found to be very stenotic mainly from scarring.  The nerve was freed proximally and distally down to the level of the superficial palmar arch and proximally to the distal deep forearm fascia.  The wound was irrigated and closed with 3-0 nylon suture.    SURGEON:  Surgeon(s) and Role:    * Vickki Hearing, MD - Primary  PHYSICIAN ASSISTANT:   ASSISTANTS: none   ANESTHESIA:   regional  EBL:  Total I/O In: 500 [I.V.:500] Out: -   BLOOD ADMINISTERED:none  DRAINS: NO   LOCAL MEDICATIONS USED:  MARCAINE   17CC  SPECIMEN:  No Specimen  DISPOSITION OF SPECIMEN:  N/A  COUNTS:  YES  TOURNIQUET:   Total Tourniquet Time Documented: Upper Arm (Left) - 28 minutes  DICTATION: .Reubin Milan Dictation  PLAN OF CARE: Discharge to home after PACU  PATIENT DISPOSITION:  PACU - hemodynamically stable.   Delay start of Pharmacological VTE agent (>24hrs) due to surgical blood loss or risk of bleeding: not applicable

## 2011-05-12 NOTE — Anesthesia Postprocedure Evaluation (Signed)
  Anesthesia Post-op Note  Patient: Melissa Webb  Procedure(s) Performed: Procedure(s) (LRB): REMOVAL FOREIGN BODY EXTREMITY (Left)  Patient Location: PACU  Anesthesia Type: Regional  Level of Consciousness: awake, alert , oriented and patient cooperative  Airway and Oxygen Therapy: Patient Spontanous Breathing and Patient connected to face mask oxygen  Post-op Pain: mild  Post-op Assessment: Post-op Vital signs reviewed, Patient's Cardiovascular Status Stable, Respiratory Function Stable, Patent Airway and No signs of Nausea or vomiting  Post-op Vital Signs: Reviewed and stable  Complications: No apparent anesthesia complications

## 2011-05-15 ENCOUNTER — Ambulatory Visit (INDEPENDENT_AMBULATORY_CARE_PROVIDER_SITE_OTHER): Payer: Medicaid Other | Admitting: Orthopedic Surgery

## 2011-05-15 ENCOUNTER — Encounter: Payer: Self-pay | Admitting: Orthopedic Surgery

## 2011-05-15 VITALS — BP 90/50 | Ht 62.0 in | Wt 167.0 lb

## 2011-05-15 DIAGNOSIS — G5600 Carpal tunnel syndrome, unspecified upper limb: Secondary | ICD-10-CM

## 2011-05-15 NOTE — Patient Instructions (Signed)
Do not change dressing   Limit use of the hand   Return for suture removal

## 2011-05-15 NOTE — Progress Notes (Signed)
Patient ID: Melissa Webb, female   DOB: 06/09/1950, 61 y.o.   MRN: [id number] Chief Complaint  Patient presents with  . Routine Post Op    post op 1 left hand, DOS 05/12/11    Procedure removal of foreign body, LEFT wrist, and exploration, LEFT wrist with release of median nerve.  Operative findings 3-0 nylon suture. Retained and stenotic carpal tunnel.  The incision is clean.  The paresthesias that were felt with the arm in the dependent position have diminished, and no longer present

## 2011-05-16 ENCOUNTER — Encounter (HOSPITAL_COMMUNITY): Payer: Self-pay | Admitting: Orthopedic Surgery

## 2011-05-25 ENCOUNTER — Encounter: Payer: Self-pay | Admitting: Orthopedic Surgery

## 2011-05-25 ENCOUNTER — Ambulatory Visit (INDEPENDENT_AMBULATORY_CARE_PROVIDER_SITE_OTHER): Payer: Medicaid Other | Admitting: Orthopedic Surgery

## 2011-05-25 VITALS — BP 100/78 | Ht 62.0 in | Wt 167.0 lb

## 2011-05-25 DIAGNOSIS — Z9889 Other specified postprocedural states: Secondary | ICD-10-CM

## 2011-05-25 DIAGNOSIS — M25539 Pain in unspecified wrist: Secondary | ICD-10-CM

## 2011-05-25 MED ORDER — HYDROCODONE-ACETAMINOPHEN 7.5-325 MG PO TABS: 1.0000 | ORAL_TABLET | ORAL | Status: DC | PRN

## 2011-05-25 MED ORDER — PREDNISONE 5 MG PO KIT: 5.0000 mg | PACK | ORAL | Status: DC

## 2011-05-25 NOTE — Progress Notes (Signed)
Patient ID: Melissa Webb, female   DOB: Dec 19, 1950, 61 y.o.   MRN: [id number] Chief Complaint  Patient presents with  . Routine Post Op    post op 2, suture removal left hand, DOS 05/12/11    BP 100/78  Ht 5\' 2"  (1.575 m)  Wt 75.751 kg (167 lb)  BMI 30.54 kg/m2  Suture removal left carpal tunnel release patient complains of right ulnar-sided wrist pain  Showed a right carpal tunnel release several weeks ago.  The left incision looks fine  She does have a small amount of swelling but overall no real difficulty so far with the left  Recommend prednisone Dosepak continue Norco 7.5 followup in one month

## 2011-05-25 NOTE — Patient Instructions (Signed)
Apply ice to wrist as needed   Take medication as needed   Take new medication as well

## 2011-06-07 ENCOUNTER — Encounter: Payer: Self-pay | Admitting: Orthopedic Surgery

## 2011-06-07 ENCOUNTER — Ambulatory Visit (INDEPENDENT_AMBULATORY_CARE_PROVIDER_SITE_OTHER): Payer: Medicaid Other | Admitting: Orthopedic Surgery

## 2011-06-07 VITALS — BP 90/60 | Ht 62.0 in | Wt 167.0 lb

## 2011-06-07 DIAGNOSIS — M792 Neuralgia and neuritis, unspecified: Secondary | ICD-10-CM

## 2011-06-07 DIAGNOSIS — Z9889 Other specified postprocedural states: Secondary | ICD-10-CM

## 2011-06-07 DIAGNOSIS — G5620 Lesion of ulnar nerve, unspecified upper limb: Secondary | ICD-10-CM

## 2011-06-07 DIAGNOSIS — G5600 Carpal tunnel syndrome, unspecified upper limb: Secondary | ICD-10-CM

## 2011-06-07 DIAGNOSIS — M25539 Pain in unspecified wrist: Secondary | ICD-10-CM

## 2011-06-07 DIAGNOSIS — IMO0002 Reserved for concepts with insufficient information to code with codable children: Secondary | ICD-10-CM

## 2011-06-07 MED ORDER — GABAPENTIN 300 MG PO CAPS: 300.0000 mg | ORAL_CAPSULE | Freq: Three times a day (TID) | ORAL | Status: DC

## 2011-06-07 MED ORDER — HYDROCODONE-ACETAMINOPHEN 7.5-325 MG PO TABS: 1.0000 | ORAL_TABLET | ORAL | Status: AC | PRN

## 2011-06-07 NOTE — Progress Notes (Signed)
Subjective:     Patient ID: Melissa Webb, female   DOB: [birth year], 61 y.o.   MRN: [id number] Chief Complaint  Patient presents with  . Wound Check    right wrist wound check, patient concerned about infection    Wound Check   status post bilateral carpal tunnel releases the left side most recently. Patient had previous right wrist fracture many years ago. Complains of ulnar-sided wrist pain and numbness and tingling radiating to the right elbow. Planes of stiffness and pain with bending the right wrist.   Review of Systems She denies neck pain or tingling or numbness radiating from the neck    Objective:   Physical Exam Exam shows a well-healed bilateral carpal tunnel scars with tenderness of the right wrist over the ulna was painful range of motion of the wrist and pain and    Assessment:     Differential diagnosis ulnar neuritis versus triangular fibrocartilage tear or inflammation    Plan:     Recommend nerve conduction study, brace for the right wrist which she already has; start gabapentin 300 mg 3 times a day

## 2011-06-07 NOTE — Patient Instructions (Addendum)
Nerve study upper extremity , right hand / elbow ulnar neuritis

## 2011-06-12 ENCOUNTER — Other Ambulatory Visit: Payer: Self-pay | Admitting: *Deleted

## 2011-06-12 NOTE — Progress Notes (Signed)
Patient has been referred to Dr Gerilyn Pilgrim for NCS study Faxed 06/09/11 Adella Hare, LPN

## 2011-06-14 ENCOUNTER — Other Ambulatory Visit: Payer: Self-pay | Admitting: Orthopedic Surgery

## 2011-06-14 DIAGNOSIS — M659 Synovitis and tenosynovitis, unspecified: Secondary | ICD-10-CM

## 2011-06-14 MED ORDER — PREDNISONE 10 MG PO TABS: 10.0000 mg | ORAL_TABLET | Freq: Two times a day (BID) | ORAL | Status: DC

## 2011-06-16 ENCOUNTER — Telehealth: Payer: Self-pay | Admitting: Orthopedic Surgery

## 2011-06-16 NOTE — Telephone Encounter (Signed)
Patient had called on 06/14/11 to check on status of referral to Dr. Gerilyn Pilgrim per her last office visit here.  I called Dr. Ronal Fear office 06/14/11, left voice message for Irving Burton, to check on it.

## 2011-06-20 ENCOUNTER — Ambulatory Visit: Payer: Medicaid Other | Admitting: Orthopedic Surgery

## 2011-06-20 NOTE — Telephone Encounter (Signed)
May 29 @ 11am patient is aware per Irving Burton

## 2011-06-22 ENCOUNTER — Ambulatory Visit (INDEPENDENT_AMBULATORY_CARE_PROVIDER_SITE_OTHER): Payer: Medicaid Other | Admitting: Orthopedic Surgery

## 2011-06-22 ENCOUNTER — Encounter: Payer: Self-pay | Admitting: Orthopedic Surgery

## 2011-06-22 VITALS — BP 122/72 | Ht 62.0 in | Wt 167.0 lb

## 2011-06-22 DIAGNOSIS — S66819A Strain of other specified muscles, fascia and tendons at wrist and hand level, unspecified hand, initial encounter: Secondary | ICD-10-CM

## 2011-06-22 DIAGNOSIS — S63599A Other specified sprain of unspecified wrist, initial encounter: Secondary | ICD-10-CM

## 2011-06-22 NOTE — Progress Notes (Signed)
Patient ID: Melissa Webb, female   DOB: 25-May-1950, 61 y.o.   MRN: [id number] Chief Complaint  Patient presents with  . Follow-up    4 week recheck Left hand, DOS 05/12/11    BP 122/72  Ht 5\' 2"  (1.575 m)  Wt 167 lb (75.751 kg)  BMI 30.54 kg/m2  The steroid seemed to have helped her in terms of the swelling of the LEFT wrist and the carpal tunnel symptoms have improved, although she still has some night numbness no tingling and no burning.  The RIGHT wrist is still painful over the triangular fibrocartilage with palpable motion and instability. At that level.  She has been braced for greater than 6 weeks and has been on steroids and anti-inflammatories, including ibuprofen, as well as hydrocodone, and no improvement.  Recommend MRI RIGHT wrist to evaluate for ulnar carpal instability and triangular fibrocartilage tear

## 2011-06-22 NOTE — Patient Instructions (Signed)
Continue brace and steroids   MRI will be scheduled

## 2011-06-29 ENCOUNTER — Encounter (HOSPITAL_COMMUNITY): Payer: Self-pay

## 2011-06-29 ENCOUNTER — Inpatient Hospital Stay (HOSPITAL_COMMUNITY)
Admission: EM | Admit: 2011-06-29 | Discharge: 2011-07-02 | DRG: 563 | Disposition: A | Discharge status: Home / Self Care | Payer: Medicaid Other | Attending: General Surgery | Admitting: General Surgery

## 2011-06-29 ENCOUNTER — Emergency Department (HOSPITAL_COMMUNITY): Payer: Medicaid Other

## 2011-06-29 DIAGNOSIS — Z9889 Other specified postprocedural states: Secondary | ICD-10-CM

## 2011-06-29 DIAGNOSIS — G5600 Carpal tunnel syndrome, unspecified upper limb: Secondary | ICD-10-CM

## 2011-06-29 DIAGNOSIS — S139XXA Sprain of joints and ligaments of unspecified parts of neck, initial encounter: Secondary | ICD-10-CM

## 2011-06-29 DIAGNOSIS — F411 Generalized anxiety disorder: Secondary | ICD-10-CM | POA: Diagnosis present

## 2011-06-29 DIAGNOSIS — S63599A Other specified sprain of unspecified wrist, initial encounter: Secondary | ICD-10-CM

## 2011-06-29 DIAGNOSIS — Z9089 Acquired absence of other organs: Secondary | ICD-10-CM

## 2011-06-29 DIAGNOSIS — S27329A Contusion of lung, unspecified, initial encounter: Secondary | ICD-10-CM | POA: Diagnosis present

## 2011-06-29 DIAGNOSIS — K219 Gastro-esophageal reflux disease without esophagitis: Secondary | ICD-10-CM | POA: Diagnosis present

## 2011-06-29 DIAGNOSIS — G8929 Other chronic pain: Secondary | ICD-10-CM | POA: Diagnosis present

## 2011-06-29 DIAGNOSIS — S060X9A Concussion with loss of consciousness of unspecified duration, initial encounter: Secondary | ICD-10-CM | POA: Diagnosis present

## 2011-06-29 DIAGNOSIS — Z87891 Personal history of nicotine dependence: Secondary | ICD-10-CM

## 2011-06-29 DIAGNOSIS — M1990 Unspecified osteoarthritis, unspecified site: Secondary | ICD-10-CM | POA: Diagnosis present

## 2011-06-29 DIAGNOSIS — G988 Other disorders of nervous system: Secondary | ICD-10-CM | POA: Diagnosis present

## 2011-06-29 DIAGNOSIS — Y92410 Unspecified street and highway as the place of occurrence of the external cause: Secondary | ICD-10-CM

## 2011-06-29 DIAGNOSIS — S060X1A Concussion with loss of consciousness of 30 minutes or less, initial encounter: Secondary | ICD-10-CM

## 2011-06-29 DIAGNOSIS — J939 Pneumothorax, unspecified: Secondary | ICD-10-CM

## 2011-06-29 DIAGNOSIS — S42213A Unspecified displaced fracture of surgical neck of unspecified humerus, initial encounter for closed fracture: Principal | ICD-10-CM | POA: Diagnosis present

## 2011-06-29 DIAGNOSIS — S161XXA Strain of muscle, fascia and tendon at neck level, initial encounter: Secondary | ICD-10-CM

## 2011-06-29 DIAGNOSIS — S42302A Unspecified fracture of shaft of humerus, left arm, initial encounter for closed fracture: Secondary | ICD-10-CM

## 2011-06-29 DIAGNOSIS — S42309A Unspecified fracture of shaft of humerus, unspecified arm, initial encounter for closed fracture: Secondary | ICD-10-CM

## 2011-06-29 DIAGNOSIS — G5620 Lesion of ulnar nerve, unspecified upper limb: Secondary | ICD-10-CM

## 2011-06-29 HISTORY — DX: Carpal tunnel syndrome, right upper limb: G56.01

## 2011-06-29 MED ORDER — MORPHINE SULFATE 4 MG/ML IJ SOLN
4.0000 mg | INTRAMUSCULAR | Status: DC | PRN
  Administered 2011-06-30 – 2011-07-01 (×12): 4 mg via INTRAVENOUS
  Filled 2011-06-29 (×12): qty 1

## 2011-06-29 MED ORDER — SODIUM CHLORIDE 0.9 % IV SOLN
INTRAVENOUS | Status: DC
  Administered 2011-06-29: 20:00:00 via INTRAVENOUS
  Administered 2011-06-30: 20 mL/h via INTRAVENOUS

## 2011-06-29 MED ORDER — ONDANSETRON HCL 4 MG/2ML IJ SOLN
4.0000 mg | INTRAMUSCULAR | Status: DC | PRN
  Administered 2011-06-29: 4 mg via INTRAVENOUS
  Filled 2011-06-29 (×2): qty 2

## 2011-06-29 MED ORDER — ONDANSETRON HCL 4 MG PO TABS
4.0000 mg | ORAL_TABLET | Freq: Four times a day (QID) | ORAL | Status: DC | PRN
  Administered 2011-06-30: 4 mg via ORAL
  Filled 2011-06-29: qty 1

## 2011-06-29 MED ORDER — ONDANSETRON HCL 4 MG/2ML IJ SOLN
4.0000 mg | Freq: Four times a day (QID) | INTRAMUSCULAR | Status: DC | PRN
  Administered 2011-06-29: 4 mg via INTRAVENOUS

## 2011-06-29 MED ORDER — FENTANYL CITRATE 0.05 MG/ML IJ SOLN
50.0000 ug | INTRAMUSCULAR | Status: AC | PRN
  Administered 2011-06-29: 20:00:00 via INTRAVENOUS
  Administered 2011-06-29: 50 ug via INTRAVENOUS
  Filled 2011-06-29 (×2): qty 2

## 2011-06-29 NOTE — ED Notes (Signed)
Dr. Carolynne Edouard notified that pt is in [facility] ED
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
## 2011-06-29 NOTE — ED Notes (Signed)
Dr Toth at bedside. 
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
## 2011-06-30 ENCOUNTER — Encounter (HOSPITAL_COMMUNITY): Payer: Self-pay | Admitting: Radiology

## 2011-06-30 ENCOUNTER — Inpatient Hospital Stay (HOSPITAL_COMMUNITY): Payer: Medicaid Other

## 2011-06-30 DIAGNOSIS — S161XXA Strain of muscle, fascia and tendon at neck level, initial encounter: Secondary | ICD-10-CM

## 2011-06-30 DIAGNOSIS — S42302A Unspecified fracture of shaft of humerus, left arm, initial encounter for closed fracture: Secondary | ICD-10-CM

## 2011-06-30 LAB — COMPREHENSIVE METABOLIC PANEL
ALT: 30 U/L (ref 0–35)
Alkaline Phosphatase: 79 U/L (ref 39–117)
BUN: 7 mg/dL (ref 6–23)
CO2: 27 mEq/L (ref 19–32)
GFR calc Af Amer: >90 mL/min (ref >90)
GFR calc non Af Amer: >90 mL/min (ref >90)
Glucose, Bld: 78 mg/dL (ref 70–99)
Potassium: 3.8 mEq/L (ref 3.5–5.1)
Sodium: 137 mEq/L (ref 135–145)
Total Bilirubin: 0.4 mg/dL (ref 0.3–1.2)

## 2011-06-30 LAB — POCT I-STAT, CHEM 8
BUN: 5 mg/dL — ABNORMAL LOW (ref 6–23)
Calcium, Ion: 1.08 mmol/L — ABNORMAL LOW (ref 1.12–1.32)
HCT: 37 % (ref 36.0–46.0)
Hemoglobin: 12.6 g/dL (ref 12.0–15.0)
TCO2: 25 mmol/L (ref 0–100)

## 2011-06-30 LAB — CBC
HCT: 36.2 % (ref 36.0–46.0)
Hemoglobin: 12.1 g/dL (ref 12.0–15.0)
RBC: 3.93 MIL/uL (ref 3.87–5.11)

## 2011-06-30 MED ORDER — KCL IN DEXTROSE-NACL 20-5-0.9 MEQ/L-%-% IV SOLN
INTRAVENOUS | Status: DC
  Administered 2011-06-30: 03:00:00 via INTRAVENOUS
  Filled 2011-06-30 (×7): qty 1000

## 2011-06-30 MED ORDER — OXYCODONE HCL 5 MG PO TABS
5.0000 mg | ORAL_TABLET | ORAL | Status: DC | PRN
  Administered 2011-06-30 – 2011-07-02 (×7): 10 mg via ORAL
  Filled 2011-06-30 (×9): qty 2

## 2011-06-30 MED ORDER — PANTOPRAZOLE SODIUM 40 MG PO TBEC
40.0000 mg | DELAYED_RELEASE_TABLET | Freq: Every day | ORAL | Status: DC
  Administered 2011-06-30 – 2011-07-01 (×2): 40 mg via ORAL
  Filled 2011-06-30 (×2): qty 1

## 2011-06-30 MED ORDER — PANTOPRAZOLE SODIUM 40 MG IV SOLR
40.0000 mg | Freq: Every day | INTRAVENOUS | Status: DC
  Filled 2011-06-30 (×2): qty 40

## 2011-06-30 MED ORDER — TIZANIDINE HCL 4 MG PO TABS
4.0000 mg | ORAL_TABLET | Freq: Three times a day (TID) | ORAL | Status: DC | PRN
  Administered 2011-06-30: 4 mg via ORAL
  Filled 2011-06-30: qty 1
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
## 2011-06-30 NOTE — ED Notes (Signed)
April (daughter) [phone]
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
## 2011-06-30 NOTE — ED Notes (Signed)
Attempted to call daughter, April, at number provided in notes.  Straight to voicemail, which was not set up.  Unable to leave message at this time.
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
## 2011-06-30 NOTE — Progress Notes (Signed)
UR complete 
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
## 2011-07-01 NOTE — Progress Notes (Signed)
Physical Therapy Treatment Patient Details Name: Melissa Webb MRN: [id number] DOB: 11-May-1950 Today's Date: 07/01/2011 Time: 1478-2956 PT Time Calculation (min): 24 min  PT Assessment / Plan / Recommendation Comments on Treatment Session  Pt continues to require max encouragement to participate.  Was sitting EOB independently upon arrival eating breakfast.  Self-limiting at times.    Follow Up Recommendations  Supervision/Assistance - 24 hour;No PT follow up    Barriers to Discharge        Equipment Recommendations  None recommended by PT    Recommendations for Other Services    Frequency Min 5X/week   Plan Discharge plan remains appropriate;Frequency remains appropriate    Precautions / Restrictions Precautions Precautions: Fall Required Braces or Orthoses: Other Brace/Splint Other Brace/Splint: Sling L UE Restrictions Weight Bearing Restrictions: Yes LUE Weight Bearing: Non weight bearing   Pertinent Vitals/Pain C/o 10/10 pain-headache and shoulder.  RN alerted and brought meds.  Ice pack provided to shoulder.    Mobility  Bed Mobility Bed Mobility: Sit to Supine Supine to Sit: 4: Min assist;HOB elevated (45) Details for Bed Mobility Assistance: Pt needed min assist with LE's back to bed.  Moves slowly. Transfers Transfers: Sit to Stand;Stand to Sit Sit to Stand: 4: Min assist;With upper extremity assist;From bed;From toilet Stand to Sit: 4: Min assist;With upper extremity assist;To bed;To toilet Details for Transfer Assistance: Encouragement needed.  Requires increased time to complete task. Ambulation/Gait Ambulation/Gait Assistance: 4: Min assist Ambulation Distance (Feet): 15 Feet (twice) Assistive device: 1 person hand held assist Ambulation/Gait Assistance Details: Max encouragement to participate. Gait Pattern: Step-through pattern;Decreased stride length;Antalgic Gait velocity: slow Stairs: No        PT Goals Acute Rehab PT Goals Time For Goal  Achievement: 07/07/11 Potential to Achieve Goals: Good PT Goal: Sit to Supine/Side - Progress: Progressing toward goal PT Transfer Goal: Bed to Chair/Chair to Bed - Progress: Progressing toward goal PT Goal: Ambulate - Progress: Progressing toward goal  Visit Information  Last PT Received On: 07/01/11 Assistance Needed: +2    Subjective Data  Subjective: Pt continues to c/o 10/10 headache and "sore" all over.  Pt continues to wear sunglasses due to light sensitivity.   Cognition  Overall Cognitive Status: Impaired Area of Impairment: Executive functioning;Problem solving;Attention Arousal/Alertness: Awake/alert Orientation Level: Oriented X4 / Intact Behavior During Session: Anxious Current Attention Level: Sustained Problem Solving: pt self limiting and requires max encouragement to try all transfers         End of Session PT - End of Session Equipment Utilized During Treatment: Gait belt Activity Tolerance: Patient limited by pain Patient left: in bed;with call bell/phone within reach;with nursing in room Nurse Communication: Mobility status;Weight bearing status    Newell Coral 07/01/2011, 12:31 PM  Newell Coral, PTA Acute Rehab [phone] (office)
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
## 2011-07-01 NOTE — Progress Notes (Signed)
Occupational Therapy Treatment Patient Details Name: Melissa Webb MRN: [id number] DOB: Mar 26, 1950 Today's Date: 07/01/2011 Time: 1340-1400 OT Time Calculation (min): 20 min  OT Assessment / Plan / Recommendation    Follow Up Recommendations  Home health OT;Supervision/Assistance - 24 hour       Equipment Recommendations  None recommended by OT       Frequency Min 2X/week   Plan Discharge plan remains appropriate    Precautions / Restrictions Precautions Precautions: Fall Required Braces or Orthoses: Other Brace/Splint Other Brace/Splint: Sling L UE Restrictions Weight Bearing Restrictions: Yes LUE Weight Bearing: Non weight bearing   Vitals : 10/10 left shoulder    ADL  ADL Comments: Pt. going home with daughter tomorrow and will have first floor setup. Educated pt. on techniques for completing ADLs with bil UE restrictions. Pt. adamantly refusing OOB activity at this time due to pain level and "head throbbing". Pt. will benefit from further education on completing ADLs safely due to pt. pain a limiting factor.      OT Goals Acute Rehab OT Goals OT Goal Formulation: With patient Time For Goal Achievement: 07/08/11 Potential to Achieve Goals: Good ADL Goals Pt Will Perform Upper Body Dressing: with set-up;with supervision;Standing ADL Goal: Upper Body Dressing - Progress: Goal set today Pt Will Transfer to Toilet: with supervision;with DME;3-in-1 ADL Goal: Toilet Transfer - Progress: Goal set today Pt Will Perform Toileting - Hygiene: with set-up;with supervision;Sit to stand from 3-in-1/toilet ADL Goal: Toileting - Hygiene - Progress: Goal set today Additional ADL Goal #1: Pt. will don/doff sling independently. ADL Goal: Additional Goal #1 - Progress: Goal set today Arm Goals Pt Will Perform AROM: with supervision, verbal cues required/provided;Left upper extremity;1 set;10 reps (distal of shoulder) Arm Goal: AROM - Progress: Goal set today  Visit Information  Last  OT Received On: 07/01/11 Assistance Needed: +1          Cognition  Overall Cognitive Status: Impaired Area of Impairment: Executive functioning;Problem solving;Attention Arousal/Alertness: Awake/alert Orientation Level: Oriented X4 / Intact Behavior During Session: Anxious Current Attention Level: Sustained Problem Solving: pt self limiting and requires max encouragement to try all transfers    Mobility Bed Mobility Bed Mobility: Sitting - Scoot to Edge of Bed;Scooting to HOB Supine to Sit: 4: Min assist;HOB elevated (45) Sitting - Scoot to Edge of Bed: 4: Min assist Scooting to [facility]: 4: Min assist Details for Bed Mobility Assistance: Pt. provided assist to scoot up in bed with use of pad to facilitate scoot up Transfers Transfers: Not assessed Sit to Stand: 4: Min assist;With upper extremity assist;From bed;From toilet Stand to Sit: 4: Min assist;With upper extremity assist;To bed;To toilet Details for Transfer Assistance: Encouragement needed.  Requires increased time to complete task.   Exercises General Exercises - Upper Extremity Elbow Flexion: 10 reps;Left;Supine;AAROM Elbow Extension: AAROM;10 reps;Supine Wrist Flexion: AROM;5 reps;Left;Supine Wrist Extension: AROM;5 reps;Supine;Left Digit Composite Flexion: AROM;Left;10 reps;Supine Composite Extension: AROM;Left;Supine;10 reps     End of Session OT - End of Session Equipment Utilized During Treatment: Gait belt Activity Tolerance: Patient limited by pain Patient left: in chair;with call bell/phone within reach Nurse Communication: Mobility status   Kelissa Merlin, OTR/L Pager [phone] 07/01/2011, 2:45 PM

## 2011-07-01 NOTE — Progress Notes (Signed)
Orthopedic Tech Progress Note Patient Details:  Melissa Webb 10-Feb-1950 [id number]  Ortho Devices Type of Ortho Device: Sling immobilizer Ortho Device/Splint Interventions: Application   Shawnie Pons 07/01/2011, 1:28 AM

## 2011-07-02 MED ORDER — OXYCODONE HCL 5 MG PO TABS: 5.0000 mg | ORAL_TABLET | ORAL | Status: AC | PRN

## 2011-07-02 NOTE — Discharge Instructions (Signed)
Humerus Fracture, Treated with Immobilization The humerus is the large bone in your upper arm. You have a broken (fractured) humerus. These fractures are easily diagnosed with X-rays. TREATMENT  Simple fractures which will heal without disability are treated with simple immobilization. Immobilization means you will wear a cast, splint, or sling. You have a fracture which will do well with immobilization. The fracture will heal well simply by being held in a good position until it is stable enough to begin range of motion exercises. Do not take part in activities which would further injure your arm.  HOME CARE INSTRUCTIONS   Put ice on the injured area.   Put ice in a plastic bag.   Place a towel between your skin and the bag.   Leave the ice on for 15 to 20 minutes, 3 to 4 times a day.   If you have a cast:   Do not scratch the skin under the cast using sharp or pointed objects.   Check the skin around the cast every day. You may put lotion on any red or sore areas.   Keep your cast dry and clean.   If you have a splint:   Wear the splint as directed.   Keep your splint dry and clean.   You may loosen the elastic around the splint if your fingers become numb, tingle, or turn cold or blue.   If you have a sling:   Wear the sling as directed.   Do not put pressure on any part of your cast or splint until it is fully hardened.   Your cast or splint can be protected during bathing with a plastic bag. Do not lower the cast or splint into water.   Only take over-the-counter or prescription medicines for pain, discomfort, or fever as directed by your caregiver.   Do range of motion exercises as instructed by your caregiver.   Follow up as directed by your caregiver. This is very important in order to avoid permanent injury or disability and chronic pain.  SEEK IMMEDIATE MEDICAL CARE IF:   Your skin or nails in the injured arm turn blue or gray.   Your arm feels cold or numb.     You develop severe pain in the injured arm.   You are having problems with the medicines you were given.  MAKE SURE YOU:   Understand these instructions.   Will watch your condition.   Will get help right away if you are not doing well or get worse.  Document Released: 04/17/2000 Document Revised: 12/29/2010 Document Reviewed: 02/23/2010 Exit[employer] Patient Information 2012 [employer]. 
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
## 2011-07-10 ENCOUNTER — Telehealth: Payer: Self-pay | Admitting: Radiology
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
## 2011-07-11 NOTE — Progress Notes (Signed)
Dominigue Gellner, MD, MPH, FACS Pager: [phone]  
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
## 2011-07-18 NOTE — Discharge Summary (Signed)
Melissa Cabello, MD, MPH, FACS Pager: [phone]  

## 2011-07-24 ENCOUNTER — Ambulatory Visit (INDEPENDENT_AMBULATORY_CARE_PROVIDER_SITE_OTHER): Payer: Medicaid Other | Admitting: Orthopedic Surgery

## 2011-07-24 ENCOUNTER — Encounter: Payer: Self-pay | Admitting: Orthopedic Surgery

## 2011-07-24 VITALS — BP 114/78 | Ht 62.0 in | Wt 167.0 lb

## 2011-07-24 DIAGNOSIS — S63599A Other specified sprain of unspecified wrist, initial encounter: Secondary | ICD-10-CM

## 2011-07-24 MED ORDER — HYDROCODONE-ACETAMINOPHEN 5-325 MG PO TABS: 1.0000 | ORAL_TABLET | ORAL | Status: AC | PRN

## 2011-07-24 MED ORDER — PREDNISONE 10 MG PO TABS: 10.0000 mg | ORAL_TABLET | Freq: Two times a day (BID) | ORAL | Status: DC | PRN

## 2011-07-24 NOTE — Patient Instructions (Addendum)
Call us after Dr Rayburn Ma has finished treating shoulder
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
## 2011-11-01 ENCOUNTER — Ambulatory Visit (INDEPENDENT_AMBULATORY_CARE_PROVIDER_SITE_OTHER): Payer: Medicaid Other | Admitting: Orthopedic Surgery

## 2011-11-01 ENCOUNTER — Ambulatory Visit (INDEPENDENT_AMBULATORY_CARE_PROVIDER_SITE_OTHER): Payer: Medicaid Other

## 2011-11-01 ENCOUNTER — Encounter: Payer: Self-pay | Admitting: Orthopedic Surgery

## 2011-11-01 VITALS — BP 100/68 | Ht 62.0 in | Wt 180.0 lb

## 2011-11-01 DIAGNOSIS — M25539 Pain in unspecified wrist: Secondary | ICD-10-CM

## 2011-11-01 DIAGNOSIS — M25531 Pain in right wrist: Secondary | ICD-10-CM

## 2011-11-01 DIAGNOSIS — G5621 Lesion of ulnar nerve, right upper limb: Secondary | ICD-10-CM

## 2011-11-01 DIAGNOSIS — G5620 Lesion of ulnar nerve, unspecified upper limb: Secondary | ICD-10-CM

## 2011-11-01 DIAGNOSIS — S63599A Other specified sprain of unspecified wrist, initial encounter: Secondary | ICD-10-CM

## 2011-11-01 MED ORDER — HYDROCODONE-ACETAMINOPHEN 5-325 MG PO TABS: 1.0000 | ORAL_TABLET | Freq: Four times a day (QID) | ORAL | Status: DC | PRN
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
## 2011-11-01 NOTE — Patient Instructions (Addendum)
MRI wrist THIS WILL REQUIRE A PRE-CERTIFICATION FROM MEDICAID, PLEASE ALLOW 7-10 DAYS TO GET PRE-CERTIFICATION   You will also have a nerve study ordered   I will call you with the results of the MRI and advise you as to treatment for the pain in your wrist

## 2011-11-02 ENCOUNTER — Telehealth: Payer: Self-pay | Admitting: *Deleted

## 2011-11-02 NOTE — Telephone Encounter (Signed)
Referral sent to Dr Gerilyn Pilgrim for NCS for right cubital tunnel syndrome

## 2011-11-14 ENCOUNTER — Telehealth: Payer: Self-pay | Admitting: Radiology

## 2011-11-14 NOTE — Telephone Encounter (Signed)
Patient has an MRI appointment at [facility] on 11-16-11 at 10:00. Patient has Medicaid, authorization # [id number] and it expires on 12-07-11. Patient will follow up back in the office for her results.

## 2011-11-16 ENCOUNTER — Other Ambulatory Visit (HOSPITAL_COMMUNITY): Payer: Medicaid Other

## 2011-11-17 ENCOUNTER — Ambulatory Visit (HOSPITAL_COMMUNITY)
Admission: RE | Admit: 2011-11-17 | Discharge: 2011-11-17 | Disposition: A | Discharge status: Home / Self Care | Payer: Medicaid Other | Source: Ambulatory Visit | Attending: Orthopedic Surgery | Admitting: Orthopedic Surgery

## 2011-11-17 DIAGNOSIS — S63599A Other specified sprain of unspecified wrist, initial encounter: Secondary | ICD-10-CM

## 2011-11-20 ENCOUNTER — Telehealth: Payer: Self-pay | Admitting: Orthopedic Surgery

## 2011-11-20 NOTE — Telephone Encounter (Signed)
1.  Melissa Webb is scheduled for MRI results 11/23/11. In last appointment notes you said return for MRI and NCS results.  She has not had the NCS  Done yet, so do you want her to wait until both test have been done?  2.  She called to let you know that her right wrist has been swollen for about 3 days.  She has been icing but so far it has not helped much at all.  Her # [phone]  or [phone]

## 2011-11-22 ENCOUNTER — Other Ambulatory Visit: Payer: Self-pay | Admitting: *Deleted

## 2011-11-22 ENCOUNTER — Ambulatory Visit (HOSPITAL_COMMUNITY)
Admission: RE | Admit: 2011-11-22 | Discharge: 2011-11-22 | Disposition: A | Discharge status: Home / Self Care | Payer: Medicaid Other | Source: Ambulatory Visit | Attending: Orthopedic Surgery | Admitting: Orthopedic Surgery

## 2011-11-22 ENCOUNTER — Ambulatory Visit: Payer: Medicaid Other | Admitting: Orthopedic Surgery

## 2011-11-22 DIAGNOSIS — M25531 Pain in right wrist: Secondary | ICD-10-CM

## 2011-11-22 DIAGNOSIS — R209 Unspecified disturbances of skin sensation: Secondary | ICD-10-CM | POA: Insufficient documentation

## 2011-11-22 DIAGNOSIS — R937 Abnormal findings on diagnostic imaging of other parts of musculoskeletal system: Secondary | ICD-10-CM | POA: Insufficient documentation

## 2011-11-22 DIAGNOSIS — G5621 Lesion of ulnar nerve, right upper limb: Secondary | ICD-10-CM

## 2011-11-22 DIAGNOSIS — M25539 Pain in unspecified wrist: Secondary | ICD-10-CM | POA: Insufficient documentation

## 2011-11-22 MED ORDER — HYDROCODONE-ACETAMINOPHEN 5-325 MG PO TABS: 1.0000 | ORAL_TABLET | Freq: Four times a day (QID) | ORAL | Status: DC | PRN

## 2011-11-23 ENCOUNTER — Ambulatory Visit: Payer: Medicaid Other | Admitting: Orthopedic Surgery

## 2011-12-14 ENCOUNTER — Ambulatory Visit (INDEPENDENT_AMBULATORY_CARE_PROVIDER_SITE_OTHER): Payer: Medicaid Other | Admitting: Orthopedic Surgery

## 2011-12-14 ENCOUNTER — Encounter: Payer: Self-pay | Admitting: Orthopedic Surgery

## 2011-12-14 DIAGNOSIS — M25539 Pain in unspecified wrist: Secondary | ICD-10-CM

## 2011-12-14 DIAGNOSIS — M25339 Other instability, unspecified wrist: Secondary | ICD-10-CM | POA: Insufficient documentation

## 2011-12-14 DIAGNOSIS — G5621 Lesion of ulnar nerve, right upper limb: Secondary | ICD-10-CM | POA: Insufficient documentation

## 2011-12-14 DIAGNOSIS — Z9889 Other specified postprocedural states: Secondary | ICD-10-CM

## 2011-12-14 DIAGNOSIS — M24849 Other specific joint derangements of unspecified hand, not elsewhere classified: Secondary | ICD-10-CM

## 2011-12-14 DIAGNOSIS — M25531 Pain in right wrist: Secondary | ICD-10-CM

## 2011-12-14 DIAGNOSIS — G5600 Carpal tunnel syndrome, unspecified upper limb: Secondary | ICD-10-CM

## 2011-12-14 DIAGNOSIS — G5620 Lesion of ulnar nerve, unspecified upper limb: Secondary | ICD-10-CM

## 2011-12-14 MED ORDER — HYDROCODONE-ACETAMINOPHEN 5-325 MG PO TABS: 1.0000 | ORAL_TABLET | ORAL | Status: DC | PRN

## 2011-12-14 MED ORDER — GABAPENTIN 300 MG PO CAPS: 300.0000 mg | ORAL_CAPSULE | Freq: Three times a day (TID) | ORAL | Status: DC

## 2011-12-14 NOTE — Progress Notes (Signed)
Patient ID: Melissa Webb, female   DOB: 1950-05-05, 61 y.o.   MRN: [id number] Chief complaint RIGHT hand carpal tunnel syndrome and LEFT hand carpal tunnel syndrome.  Patient had bilateral carpal tunnel releases and has noticed that her pain and paresthesias are back, especially on the RIGHT side.  She also had an MRI of her RIGHT wrist, which shows a DISI , deformity with possible scapholunate ligament tear  Repeat nerve conduction studies show moderate carpal tunnel syndrome.  There is abnormal dorsal tilt of the lunate with abnormal  dorsal position of the capitate consistent with dorsal intercalated  segment instability (DISI). This is usually associated with tear of  the scapholunate ligament.  The osseous structures are otherwise normal. Accessory ossicle  adjacent to the tip of the ulnar styloid. No visible tear of the  triangular fibrocartilage. However, the images are degraded by low  signal.  The flexor extensor tendons appear normal. Median nerve is normal.  No mass lesions or ganglion cysts. No abnormal joint effusions.  IMPRESSION:  Abnormal alignment of the lunate and capitate consistent with DISI.  This is an indirect indicator of a tear of the scapholunate  ligament.   1. CTS (carpal tunnel syndrome)  gabapentin (NEURONTIN) 300 MG capsule  2. Wrist pain, right  HYDROcodone-acetaminophen (NORCO) 5-325 MG per tablet  3. Cubital tunnel syndrome on right  HYDROcodone-acetaminophen (NORCO) 5-325 MG per tablet  4. S/P carpal tunnel release    5. DISI (dorsal intercalated segment instability)      We will make referral to a hand surgeon to evaluate the disi

## 2011-12-14 NOTE — Patient Instructions (Signed)
Referral to Dr Merlyn Lot for scapholunate tear

## 2012-01-18 ENCOUNTER — Telehealth: Payer: Self-pay | Admitting: Orthopedic Surgery

## 2012-01-18 NOTE — Telephone Encounter (Signed)
Spoke with [facility] about the referral to a hand specialist.  I faxed the notes to Selena Batten, and she will speak with Sereena's  PCP and will make the referral from their office.  Will call us back if any questions.

## 2012-04-01 ENCOUNTER — Telehealth: Payer: Self-pay | Admitting: Orthopedic Surgery

## 2012-04-01 NOTE — Telephone Encounter (Signed)
Patient called to relay that she has been scheduled for appointment with hand specialist, Dr. Dairl Ponder - scheduled for Thursday 04/05/11 -- per referral, as noted in her December 14, 2011 office visit.  Her primary care physician's office, [facility], handled the referral, per patient's [employer]. As per telephone note 01/18/12, we had faxed them the office notes and imaging reports.   Patient states she was advised by Dr. Ronie Spies office to bring all notes and films to the appointment.  Patient aware that she may pick up copy of notes here, and to contact [facility] radiology for copies of films.

## 2012-04-30 ENCOUNTER — Other Ambulatory Visit (HOSPITAL_COMMUNITY): Payer: Self-pay | Admitting: Family Medicine

## 2012-04-30 ENCOUNTER — Other Ambulatory Visit (HOSPITAL_COMMUNITY): Payer: Self-pay | Admitting: General Practice

## 2012-04-30 DIAGNOSIS — Z139 Encounter for screening, unspecified: Secondary | ICD-10-CM

## 2012-05-07 ENCOUNTER — Ambulatory Visit (HOSPITAL_COMMUNITY): Payer: Medicaid Other

## 2012-05-13 ENCOUNTER — Ambulatory Visit (HOSPITAL_COMMUNITY): Payer: Medicaid Other

## 2012-05-21 ENCOUNTER — Ambulatory Visit (INDEPENDENT_AMBULATORY_CARE_PROVIDER_SITE_OTHER): Payer: Medicaid Other | Admitting: Orthopedic Surgery

## 2012-05-21 ENCOUNTER — Ambulatory Visit (INDEPENDENT_AMBULATORY_CARE_PROVIDER_SITE_OTHER): Payer: Medicaid Other

## 2012-05-21 VITALS — BP 118/70 | Ht 63.0 in | Wt 181.0 lb

## 2012-05-21 DIAGNOSIS — M8708 Idiopathic aseptic necrosis of bone, other site: Secondary | ICD-10-CM

## 2012-05-21 DIAGNOSIS — M25512 Pain in left shoulder: Secondary | ICD-10-CM

## 2012-05-21 DIAGNOSIS — M25519 Pain in unspecified shoulder: Secondary | ICD-10-CM

## 2012-05-21 DIAGNOSIS — M87012 Idiopathic aseptic necrosis of left shoulder: Secondary | ICD-10-CM

## 2012-05-21 NOTE — Progress Notes (Addendum)
Patient ID: Melissa Webb, female   DOB: July 24, 1950, 62 y.o.   MRN: [id number] Chief Complaint  Patient presents with  . Shoulder Pain    Left shoulder pain d/t injury 06/29/11. Referred by Dr. Georganna Skeans    HISTORY: This patient had a traumatic injury to her left shoulder was treated in [place] with closed methods. Date of injury 06/29/2011  She's had pain medication and injection and I believe a course of physical therapy she is referred by her primary care physician complaining of sharp throbbing stabbing 10 out of 10 constant pain. She says that ice and heating pad improved her symptoms she is worse if she tries to lift it is doing housework or sleep on her left side. She complains of numbness tingling locking catching swelling and bruising  Her review of systems notable for weight gain and fatigue redness of the eyes heartburn and constipation frequency urgency changes in her skin poor healing redness tremors nervousness easy bruising excessive thirst excessive urination denies chest pain shortness of breath or seasonal allergies  She denies any medication allergies  She reports fracture to left shoulder, previous surgery on the right hand status post cholecystectomy appendectomy 3 carpal tunnel releases a tubal ligation  Current medications are Xanax hydrocodone doxycycline and Prilosec  Family history of arthritis and cancer social history divorced, not working because of disability, does not smoke or drink.  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 Magnus Ivan treating physician for her proximal humerus fracture  The patient wanted pain medicine I offered her nonnecrotic pain medicine and she declined, she's in pain management and should get pain medications from her pain management physician  X-rays show

## 2012-05-21 NOTE — Patient Instructions (Addendum)
AVN left shoulder  Refer back to DR [facility] for possible Shoulder replacement

## 2012-05-23 ENCOUNTER — Telehealth: Payer: Self-pay | Admitting: *Deleted

## 2012-05-23 NOTE — Telephone Encounter (Signed)
Faxed Dr. Mort Sawyers office notes to [facility]. They have to refer patient to Dr. Kriste Basque to patient has [place] access.

## 2012-07-17 ENCOUNTER — Ambulatory Visit
Admission: RE | Admit: 2012-07-17 | Discharge: 2012-07-17 | Disposition: A | Discharge status: Home / Self Care | Payer: Medicaid Other | Source: Ambulatory Visit | Attending: Orthopedic Surgery | Admitting: Orthopedic Surgery

## 2012-07-17 ENCOUNTER — Other Ambulatory Visit: Payer: Self-pay | Admitting: Orthopedic Surgery

## 2012-07-17 DIAGNOSIS — M25512 Pain in left shoulder: Secondary | ICD-10-CM

## 2012-07-18 ENCOUNTER — Other Ambulatory Visit: Payer: Self-pay | Admitting: Orthopedic Surgery

## 2012-07-22 ENCOUNTER — Encounter (HOSPITAL_COMMUNITY): Payer: Self-pay

## 2012-07-22 ENCOUNTER — Encounter (HOSPITAL_COMMUNITY)
Admission: RE | Admit: 2012-07-22 | Discharge: 2012-07-22 | Disposition: A | Discharge status: Home / Self Care | Payer: Medicaid Other | Source: Ambulatory Visit | Attending: Orthopedic Surgery | Admitting: Orthopedic Surgery

## 2012-07-22 LAB — URINALYSIS, ROUTINE W REFLEX MICROSCOPIC
Bilirubin Urine: NEGATIVE
Glucose, UA: NEGATIVE mg/dL
Ketones, ur: NEGATIVE mg/dL
Leukocytes, UA: NEGATIVE
Nitrite: NEGATIVE
Protein, ur: NEGATIVE mg/dL

## 2012-07-22 LAB — PROTIME-INR
INR: 0.94 (ref 0.00–1.49)
Prothrombin Time: 12.4 seconds (ref 11.6–15.2)

## 2012-07-22 LAB — APTT: aPTT: 31 seconds (ref 24–37)

## 2012-07-22 LAB — CBC WITH DIFFERENTIAL/PLATELET
Basophils Absolute: 0 10*3/uL (ref 0.0–0.1)
Basophils Relative: 0 % (ref 0–1)
Eosinophils Absolute: 0.2 10*3/uL (ref 0.0–0.7)
Eosinophils Relative: 2 % (ref 0–5)
HCT: 41.1 % (ref 36.0–46.0)
MCH: 30.9 pg (ref 26.0–34.0)
MCHC: 35.5 g/dL (ref 30.0–36.0)
MCV: 87.1 fL (ref 78.0–100.0)
Monocytes Absolute: 0.7 10*3/uL (ref 0.1–1.0)
Neutro Abs: 5.6 10*3/uL (ref 1.7–7.7)
RDW: 12.6 % (ref 11.5–15.5)

## 2012-07-22 LAB — COMPREHENSIVE METABOLIC PANEL
AST: 23 U/L (ref 0–37)
Albumin: 4.2 g/dL (ref 3.5–5.2)
BUN: 9 mg/dL (ref 6–23)
Calcium: 9.5 mg/dL (ref 8.4–10.5)
Creatinine, Ser: 0.82 mg/dL (ref 0.50–1.10)
Total Protein: 7.3 g/dL (ref 6.0–8.3)

## 2012-07-22 LAB — TYPE AND SCREEN: ABO/RH(D): B POS

## 2012-07-22 LAB — SURGICAL PCR SCREEN: Staphylococcus aureus: NEGATIVE

## 2012-07-22 NOTE — Pre-Procedure Instructions (Signed)
Melissa Webb  07/22/2012   Your procedure is scheduled on: Thursday, July 3rd.    Report to [facility] at 5:30 AM.             (Come through Entrance "A", follow signs to EAST elevators and go up to 3rd floor)   Call this number if you have problems the morning of surgery: [phone]   Remember:   Do not eat food or drink liquids after midnight Wednesday.   Take these medicines the morning of surgery with A SIP OF WATER: Xanax, Gabapentin, Norco, Omeprazole   Do not wear jewelry, make-up or nail polish.  Do not wear lotions, powders, or perfumes. You may  NOT wear deodorant.  Do not shave underarms & legs 48 hours prior to surgery.    Do not bring valuables to the hospital.  [facility] is not responsible for any belongings or valuables.   Contacts, dentures or bridgework may not be worn into surgery.  Leave suitcase in the car. After surgery it may be brought to your room.  For patients admitted to the hospital, checkout time is 11:00 AM the day of discharge.   Name and phone number of your driver:    Special Instructions: Shower using CHG 2 nights before surgery and the night before surgery.  If you shower the day of surgery use CHG.  Use special wash - you have one bottle of CHG for all showers.  You should use approximately 1/3 of the bottle for each shower.   Please read over the following fact sheets that you were given: Pain Booklet, Blood Transfusion Information, MRSA Information and Surgical Site Infection Prevention

## 2012-07-24 MED ORDER — CEFAZOLIN SODIUM-DEXTROSE 2-3 GM-% IV SOLR
2.0000 g | INTRAVENOUS | Status: AC
  Administered 2012-07-25: 2 g via INTRAVENOUS
  Filled 2012-07-24: qty 50

## 2012-07-25 ENCOUNTER — Encounter (HOSPITAL_COMMUNITY): Payer: Self-pay | Admitting: *Deleted

## 2012-07-25 ENCOUNTER — Encounter (HOSPITAL_COMMUNITY)
Admission: RE | Disposition: A | Discharge status: Home / Self Care | Payer: Self-pay | Source: Ambulatory Visit | Attending: Orthopedic Surgery

## 2012-07-25 ENCOUNTER — Inpatient Hospital Stay (HOSPITAL_COMMUNITY)
Admission: RE | Admit: 2012-07-25 | Discharge: 2012-07-27 | DRG: 483 | Disposition: A | Discharge status: Home / Self Care | Payer: Medicaid Other | Source: Ambulatory Visit | Attending: Orthopedic Surgery | Admitting: Orthopedic Surgery

## 2012-07-25 ENCOUNTER — Ambulatory Visit (HOSPITAL_COMMUNITY): Payer: Medicaid Other | Admitting: Anesthesiology

## 2012-07-25 ENCOUNTER — Encounter (HOSPITAL_COMMUNITY): Payer: Self-pay | Admitting: Anesthesiology

## 2012-07-25 ENCOUNTER — Ambulatory Visit (HOSPITAL_COMMUNITY): Payer: Medicaid Other

## 2012-07-25 DIAGNOSIS — F411 Generalized anxiety disorder: Secondary | ICD-10-CM | POA: Diagnosis present

## 2012-07-25 DIAGNOSIS — Z87891 Personal history of nicotine dependence: Secondary | ICD-10-CM

## 2012-07-25 DIAGNOSIS — K219 Gastro-esophageal reflux disease without esophagitis: Secondary | ICD-10-CM | POA: Diagnosis present

## 2012-07-25 DIAGNOSIS — M25512 Pain in left shoulder: Secondary | ICD-10-CM

## 2012-07-25 DIAGNOSIS — IMO0002 Reserved for concepts with insufficient information to code with codable children: Secondary | ICD-10-CM | POA: Diagnosis present

## 2012-07-25 DIAGNOSIS — Z7982 Long term (current) use of aspirin: Secondary | ICD-10-CM

## 2012-07-25 DIAGNOSIS — M8700 Idiopathic aseptic necrosis of unspecified bone: Secondary | ICD-10-CM

## 2012-07-25 DIAGNOSIS — M8708 Idiopathic aseptic necrosis of bone, other site: Secondary | ICD-10-CM | POA: Diagnosis present

## 2012-07-25 DIAGNOSIS — M87029 Idiopathic aseptic necrosis of unspecified humerus: Principal | ICD-10-CM | POA: Diagnosis present

## 2012-07-25 HISTORY — PX: SHOULDER HEMI-ARTHROPLASTY: SHX5049

## 2012-07-25 SURGERY — HEMIARTHROPLASTY, SHOULDER
Anesthesia: General | Site: Shoulder | Laterality: Left | Wound class: Clean

## 2012-07-25 MED ORDER — MORPHINE SULFATE 2 MG/ML IJ SOLN
1.0000 mg | INTRAMUSCULAR | Status: DC | PRN
  Administered 2012-07-25 – 2012-07-26 (×7): 1 mg via INTRAVENOUS
  Filled 2012-07-25 (×7): qty 1

## 2012-07-25 MED ORDER — FLEET ENEMA 7-19 GM/118ML RE ENEM: 1.0000 | ENEMA | Freq: Once | RECTAL | Status: AC | PRN

## 2012-07-25 MED ORDER — PROPOFOL 10 MG/ML IV BOLUS
INTRAVENOUS | Status: DC | PRN
  Administered 2012-07-25: 150 mg via INTRAVENOUS

## 2012-07-25 MED ORDER — DIPHENHYDRAMINE HCL 12.5 MG/5ML PO ELIX: 12.5000 mg | ORAL_SOLUTION | ORAL | Status: DC | PRN

## 2012-07-25 MED ORDER — HYDROMORPHONE HCL PF 1 MG/ML IJ SOLN
INTRAMUSCULAR | Status: DC | PRN
  Administered 2012-07-25: .5 mg via INTRAVENOUS
  Administered 2012-07-25 (×2): .2 mg via INTRAVENOUS
  Administered 2012-07-25: .1 mg via INTRAVENOUS

## 2012-07-25 MED ORDER — ZOLPIDEM TARTRATE 5 MG PO TABS: 5.0000 mg | ORAL_TABLET | Freq: Every evening | ORAL | Status: DC | PRN

## 2012-07-25 MED ORDER — NEOSTIGMINE METHYLSULFATE 1 MG/ML IJ SOLN
INTRAMUSCULAR | Status: DC | PRN
  Administered 2012-07-25: 4 mg via INTRAVENOUS

## 2012-07-25 MED ORDER — OXYCODONE-ACETAMINOPHEN 5-325 MG PO TABS
1.0000 | ORAL_TABLET | ORAL | Status: DC | PRN
  Administered 2012-07-25 – 2012-07-27 (×8): 2 via ORAL
  Filled 2012-07-25 (×8): qty 2

## 2012-07-25 MED ORDER — ONDANSETRON HCL 4 MG PO TABS: 4.0000 mg | ORAL_TABLET | Freq: Four times a day (QID) | ORAL | Status: DC | PRN

## 2012-07-25 MED ORDER — ASPIRIN EC 325 MG PO TBEC
325.0000 mg | DELAYED_RELEASE_TABLET | Freq: Two times a day (BID) | ORAL | Status: DC
  Administered 2012-07-25 – 2012-07-27 (×5): 325 mg via ORAL
  Filled 2012-07-25 (×7): qty 1

## 2012-07-25 MED ORDER — LACTATED RINGERS IV SOLN
INTRAVENOUS | Status: DC | PRN
  Administered 2012-07-25 (×2): via INTRAVENOUS

## 2012-07-25 MED ORDER — PHENYLEPHRINE HCL 10 MG/ML IJ SOLN
10.0000 mg | INTRAVENOUS | Status: DC | PRN
  Administered 2012-07-25: 25 ug/min via INTRAVENOUS

## 2012-07-25 MED ORDER — HYDROMORPHONE HCL PF 1 MG/ML IJ SOLN
0.2500 mg | INTRAMUSCULAR | Status: DC | PRN
  Administered 2012-07-25: 0.5 mg via INTRAVENOUS

## 2012-07-25 MED ORDER — POVIDONE-IODINE 7.5 % EX SOLN
Freq: Once | CUTANEOUS | Status: DC
  Filled 2012-07-25: qty 118

## 2012-07-25 MED ORDER — FENTANYL CITRATE 0.05 MG/ML IJ SOLN
INTRAMUSCULAR | Status: DC | PRN
  Administered 2012-07-25 (×2): 50 ug via INTRAVENOUS
  Administered 2012-07-25: 150 ug via INTRAVENOUS
  Administered 2012-07-25 (×5): 50 ug via INTRAVENOUS

## 2012-07-25 MED ORDER — SODIUM CHLORIDE 0.9 % IV SOLN
INTRAVENOUS | Status: DC
  Administered 2012-07-25: 15:00:00 via INTRAVENOUS

## 2012-07-25 MED ORDER — PHENOL 1.4 % MT LIQD: 1.0000 | OROMUCOSAL | Status: DC | PRN

## 2012-07-25 MED ORDER — DOXYCYCLINE HYCLATE 100 MG PO TABS
100.0000 mg | ORAL_TABLET | Freq: Every day | ORAL | Status: DC
  Administered 2012-07-25: 100 mg via ORAL
  Filled 2012-07-25 (×2): qty 1

## 2012-07-25 MED ORDER — ARTIFICIAL TEARS OP OINT
TOPICAL_OINTMENT | OPHTHALMIC | Status: DC | PRN
  Administered 2012-07-25: 1 via OPHTHALMIC

## 2012-07-25 MED ORDER — ACETAMINOPHEN 10 MG/ML IV SOLN
1000.0000 mg | Freq: Once | INTRAVENOUS | Status: DC
  Filled 2012-07-25: qty 100

## 2012-07-25 MED ORDER — ONDANSETRON HCL 4 MG/2ML IJ SOLN
INTRAMUSCULAR | Status: DC | PRN
  Administered 2012-07-25: 4 mg via INTRAVENOUS

## 2012-07-25 MED ORDER — LIDOCAINE HCL (CARDIAC) 20 MG/ML IV SOLN
INTRAVENOUS | Status: DC | PRN
  Administered 2012-07-25: 60 mg via INTRAVENOUS

## 2012-07-25 MED ORDER — METOCLOPRAMIDE HCL 5 MG/ML IJ SOLN: 5.0000 mg | Freq: Three times a day (TID) | INTRAMUSCULAR | Status: DC | PRN

## 2012-07-25 MED ORDER — METOCLOPRAMIDE HCL 10 MG PO TABS: 5.0000 mg | ORAL_TABLET | Freq: Three times a day (TID) | ORAL | Status: DC | PRN

## 2012-07-25 MED ORDER — SUCCINYLCHOLINE CHLORIDE 20 MG/ML IJ SOLN
INTRAMUSCULAR | Status: DC | PRN
  Administered 2012-07-25: 100 mg via INTRAVENOUS

## 2012-07-25 MED ORDER — PANTOPRAZOLE SODIUM 40 MG PO TBEC
40.0000 mg | DELAYED_RELEASE_TABLET | Freq: Every day | ORAL | Status: DC
  Administered 2012-07-26 – 2012-07-27 (×2): 40 mg via ORAL
  Filled 2012-07-25 (×2): qty 1

## 2012-07-25 MED ORDER — SODIUM CHLORIDE 0.9 % IR SOLN
Status: DC | PRN
  Administered 2012-07-25: 3000 mL

## 2012-07-25 MED ORDER — METOPROLOL TARTRATE 1 MG/ML IV SOLN
INTRAVENOUS | Status: DC | PRN
  Administered 2012-07-25 (×2): 2.5 mg via INTRAVENOUS

## 2012-07-25 MED ORDER — ALUMINUM HYDROXIDE GEL 320 MG/5ML PO SUSP
15.0000 mL | ORAL | Status: DC | PRN
  Administered 2012-07-25: 30 mL via ORAL
  Filled 2012-07-25 (×2): qty 30

## 2012-07-25 MED ORDER — OXYCODONE HCL 5 MG/5ML PO SOLN: 5.0000 mg | Freq: Once | ORAL | Status: DC | PRN

## 2012-07-25 MED ORDER — ALPRAZOLAM 0.5 MG PO TABS
0.5000 mg | ORAL_TABLET | Freq: Three times a day (TID) | ORAL | Status: DC | PRN
  Administered 2012-07-25 – 2012-07-26 (×4): 1 mg via ORAL
  Filled 2012-07-25 (×4): qty 2

## 2012-07-25 MED ORDER — BISACODYL 5 MG PO TBEC: 5.0000 mg | DELAYED_RELEASE_TABLET | Freq: Every day | ORAL | Status: DC | PRN

## 2012-07-25 MED ORDER — HYDROMORPHONE HCL PF 1 MG/ML IJ SOLN
INTRAMUSCULAR | Status: AC
  Filled 2012-07-25: qty 1

## 2012-07-25 MED ORDER — BUPIVACAINE-EPINEPHRINE (PF) 0.5% -1:200000 IJ SOLN
INTRAMUSCULAR | Status: AC
  Filled 2012-07-25: qty 10

## 2012-07-25 MED ORDER — HYDROCODONE-ACETAMINOPHEN 5-325 MG PO TABS: 1.0000 | ORAL_TABLET | ORAL | Status: DC | PRN

## 2012-07-25 MED ORDER — MENTHOL 3 MG MT LOZG: 1.0000 | LOZENGE | OROMUCOSAL | Status: DC | PRN

## 2012-07-25 MED ORDER — BUPIVACAINE-EPINEPHRINE PF 0.25-1:200000 % IJ SOLN
INTRAMUSCULAR | Status: AC
  Filled 2012-07-25: qty 30

## 2012-07-25 MED ORDER — OXYCODONE HCL 5 MG PO TABS: 5.0000 mg | ORAL_TABLET | Freq: Once | ORAL | Status: DC | PRN

## 2012-07-25 MED ORDER — POLYETHYLENE GLYCOL 3350 17 G PO PACK: 17.0000 g | PACK | Freq: Every day | ORAL | Status: DC | PRN

## 2012-07-25 MED ORDER — ACETAMINOPHEN 10 MG/ML IV SOLN
INTRAVENOUS | Status: DC | PRN
  Administered 2012-07-25: 1000 mg via INTRAVENOUS

## 2012-07-25 MED ORDER — ROCURONIUM BROMIDE 100 MG/10ML IV SOLN
INTRAVENOUS | Status: DC | PRN
  Administered 2012-07-25: 10 mg via INTRAVENOUS
  Administered 2012-07-25: 40 mg via INTRAVENOUS

## 2012-07-25 MED ORDER — ACETAMINOPHEN 650 MG RE SUPP: 650.0000 mg | Freq: Four times a day (QID) | RECTAL | Status: DC | PRN

## 2012-07-25 MED ORDER — DOCUSATE SODIUM 100 MG PO CAPS
100.0000 mg | ORAL_CAPSULE | Freq: Two times a day (BID) | ORAL | Status: DC
  Administered 2012-07-25 – 2012-07-27 (×5): 100 mg via ORAL
  Filled 2012-07-25 (×6): qty 1

## 2012-07-25 MED ORDER — OXYCODONE HCL 5 MG PO TABS
5.0000 mg | ORAL_TABLET | ORAL | Status: DC | PRN
  Administered 2012-07-25 – 2012-07-27 (×8): 10 mg via ORAL
  Filled 2012-07-25 (×7): qty 2

## 2012-07-25 MED ORDER — GLYCOPYRROLATE 0.2 MG/ML IJ SOLN
INTRAMUSCULAR | Status: DC | PRN
  Administered 2012-07-25: 0.6 mg via INTRAVENOUS

## 2012-07-25 MED ORDER — MIDAZOLAM HCL 5 MG/5ML IJ SOLN
INTRAMUSCULAR | Status: DC | PRN
  Administered 2012-07-25: 2 mg via INTRAVENOUS

## 2012-07-25 MED ORDER — SODIUM CHLORIDE 0.9 % IR SOLN
Status: DC | PRN
  Administered 2012-07-25: 1000 mL

## 2012-07-25 MED ORDER — CYCLOBENZAPRINE HCL 10 MG PO TABS
10.0000 mg | ORAL_TABLET | Freq: Three times a day (TID) | ORAL | Status: DC | PRN
  Administered 2012-07-25 – 2012-07-26 (×3): 10 mg via ORAL
  Filled 2012-07-25 (×4): qty 1

## 2012-07-25 MED ORDER — CEFAZOLIN SODIUM 1-5 GM-% IV SOLN
1.0000 g | Freq: Four times a day (QID) | INTRAVENOUS | Status: AC
  Administered 2012-07-25 – 2012-07-26 (×3): 1 g via INTRAVENOUS
  Filled 2012-07-25 (×4): qty 50

## 2012-07-25 MED ORDER — ACETAMINOPHEN 325 MG PO TABS: 650.0000 mg | ORAL_TABLET | Freq: Four times a day (QID) | ORAL | Status: DC | PRN

## 2012-07-25 MED ORDER — OXYCODONE HCL 5 MG PO TABS
ORAL_TABLET | ORAL | Status: AC
  Filled 2012-07-25: qty 2

## 2012-07-25 MED ORDER — ONDANSETRON HCL 4 MG/2ML IJ SOLN
4.0000 mg | Freq: Four times a day (QID) | INTRAMUSCULAR | Status: DC | PRN
  Administered 2012-07-25 – 2012-07-26 (×2): 4 mg via INTRAVENOUS
  Filled 2012-07-25 (×2): qty 2

## 2012-07-25 SURGICAL SUPPLY — 71 items
BALL CYLINDER TRIAL ASSEMBLY (INSTRUMENTS) ×2 IMPLANT
BALL TAPER GLOBAL SHOULDER (BALLOONS) ×2 IMPLANT
BLADE SAW SAG 73X25 THK (BLADE)
BLADE SAW SGTL 73X25 THK (BLADE) IMPLANT
BLADE SURG 15 STRL LF DISP TIS (BLADE) ×2 IMPLANT
BLADE SURG 15 STRL SS (BLADE) ×2
BOWL SMART MIX CTS (DISPOSABLE) IMPLANT
CATH URET WHISTLE 8FR 331008 (CATHETERS) ×2 IMPLANT
CHLORAPREP W/TINT 26ML (MISCELLANEOUS) ×4 IMPLANT
CLOTH BEACON ORANGE TIMEOUT ST (SAFETY) ×2 IMPLANT
CLSR STERI-STRIP ANTIMIC 1/2X4 (GAUZE/BANDAGES/DRESSINGS) ×2 IMPLANT
COVER SURGICAL LIGHT HANDLE (MISCELLANEOUS) ×2 IMPLANT
DRAPE INCISE IOBAN 66X45 STRL (DRAPES) ×2 IMPLANT
DRAPE SURG 17X23 STRL (DRAPES) ×2 IMPLANT
DRAPE U-SHAPE 47X51 STRL (DRAPES) ×2 IMPLANT
DRSG MEPILEX BORDER 4X4 (GAUZE/BANDAGES/DRESSINGS) ×2 IMPLANT
DRSG MEPILEX BORDER 4X8 (GAUZE/BANDAGES/DRESSINGS) ×2 IMPLANT
DRSG PAD ABDOMINAL 8X10 ST (GAUZE/BANDAGES/DRESSINGS) ×2 IMPLANT
ELECT BLADE 4.0 EZ CLEAN MEGAD (MISCELLANEOUS)
ELECT CAUTERY BLADE 6.4 (BLADE) ×2 IMPLANT
ELECT REM PT RETURN 9FT ADLT (ELECTROSURGICAL) ×2
ELECTRODE BLDE 4.0 EZ CLN MEGD (MISCELLANEOUS) IMPLANT
ELECTRODE REM PT RTRN 9FT ADLT (ELECTROSURGICAL) ×1 IMPLANT
EVACUATOR 1/8 PVC DRAIN (DRAIN) ×4 IMPLANT
GLOVE BIO SURGEON STRL SZ7 (GLOVE) ×4 IMPLANT
GLOVE BIO SURGEON STRL SZ7.5 (GLOVE) ×6 IMPLANT
GLOVE BIOGEL PI IND STRL 7.0 (GLOVE) ×1 IMPLANT
GLOVE BIOGEL PI IND STRL 7.5 (GLOVE) ×1 IMPLANT
GLOVE BIOGEL PI IND STRL 8 (GLOVE) ×1 IMPLANT
GLOVE BIOGEL PI INDICATOR 7.0 (GLOVE) ×1
GLOVE BIOGEL PI INDICATOR 7.5 (GLOVE) ×1
GLOVE BIOGEL PI INDICATOR 8 (GLOVE) ×1
GOWN PREVENTION PLUS LG XLONG (DISPOSABLE) ×2 IMPLANT
GOWN STRL NON-REIN LRG LVL3 (GOWN DISPOSABLE) ×6 IMPLANT
Global AP Ball Taper Assembly ×2 IMPLANT
Global AP Porocoat Standard Stem Size 6 101mm ×2 IMPLANT
HANDPIECE INTERPULSE COAX TIP (DISPOSABLE) ×1
HEAD HUM ECC 44X15MM SHOULDER (Head) ×2 IMPLANT
HEMOSTAT SURGICEL 2X14 (HEMOSTASIS) IMPLANT
HOOD PEEL AWAY FACE SHEILD DIS (HOOD) ×6 IMPLANT
KIT BASIN OR (CUSTOM PROCEDURE TRAY) ×2 IMPLANT
KIT ROOM TURNOVER OR (KITS) ×2 IMPLANT
MANIFOLD NEPTUNE II (INSTRUMENTS) ×2 IMPLANT
NDL SUT .5 MAYO 1.404X.05X (NEEDLE) ×1 IMPLANT
NEEDLE HYPO 25GX1X1/2 BEV (NEEDLE) IMPLANT
NEEDLE MAYO TAPER (NEEDLE) ×1
NEEDLE MAYO TROCAR (NEEDLE) ×2 IMPLANT
NS IRRIG 1000ML POUR BTL (IV SOLUTION) ×2 IMPLANT
PACK SHOULDER (CUSTOM PROCEDURE TRAY) ×2 IMPLANT
PAD ARMBOARD 7.5X6 YLW CONV (MISCELLANEOUS) ×4 IMPLANT
RETRIEVER SUT HEWSON (MISCELLANEOUS) ×2 IMPLANT
SET HNDPC FAN SPRY TIP SCT (DISPOSABLE) ×1 IMPLANT
SLING ARM IMMOBILIZER LRG (SOFTGOODS) ×2 IMPLANT
SLING ARM IMMOBILIZER MED (SOFTGOODS) ×2 IMPLANT
SPONGE GAUZE 4X4 12PLY (GAUZE/BANDAGES/DRESSINGS) ×2 IMPLANT
SPONGE LAP 18X18 X RAY DECT (DISPOSABLE) ×2 IMPLANT
STRIP CLOSURE SKIN 1/2X4 (GAUZE/BANDAGES/DRESSINGS) ×2 IMPLANT
SUPPORT WRAP ARM LG (MISCELLANEOUS) ×2 IMPLANT
SUT ETHIBOND NAB CT1 #1 30IN (SUTURE) ×4 IMPLANT
SUT FIBERWIRE #2 38 T-5 BLUE (SUTURE) ×6
SUT MNCRL AB 4-0 PS2 18 (SUTURE) ×2 IMPLANT
SUT MON AB 4-0 RB1 27 (SUTURE) ×2 IMPLANT
SUT VIC AB 0 CTB1 27 (SUTURE) ×4 IMPLANT
SUT VIC AB 2-0 CT1 27 (SUTURE) ×3
SUT VIC AB 2-0 CT1 TAPERPNT 27 (SUTURE) ×3 IMPLANT
SUTURE FIBERWR #2 38 T-5 BLUE (SUTURE) ×3 IMPLANT
SYR CONTROL 10ML LL (SYRINGE) IMPLANT
TOWEL OR 17X24 6PK STRL BLUE (TOWEL DISPOSABLE) ×2 IMPLANT
TOWEL OR 17X26 10 PK STRL BLUE (TOWEL DISPOSABLE) ×2 IMPLANT
TRAY FOLEY CATH 14FR (SET/KITS/TRAYS/PACK) IMPLANT
WATER STERILE IRR 1000ML POUR (IV SOLUTION) ×2 IMPLANT

## 2012-07-25 NOTE — Transfer of Care (Signed)
Immediate Anesthesia Transfer of Care Note  Patient: Melissa Webb  Procedure(s) Performed: Procedure(s): LEFT SHOULDER HEMI-ARTHROPLASTY (Left)  Patient Location: PACU  Anesthesia Type:General  Level of Consciousness: awake, alert  and oriented  Airway & Oxygen Therapy: Patient Spontanous Breathing  Post-op Assessment: Report given to PACU RN  Post vital signs: stable  Complications: No apparent anesthesia complications

## 2012-07-25 NOTE — Anesthesia Procedure Notes (Signed)
Procedure Name: Intubation Date/Time: 07/25/2012 7:27 AM Performed by: Ellin Goodie Pre-anesthesia Checklist: Patient identified, Emergency Drugs available, Suction available, Patient being monitored and Timeout performed Patient Re-evaluated:Patient Re-evaluated prior to inductionOxygen Delivery Method: Circle system utilized Preoxygenation: Pre-oxygenation with 100% oxygen Intubation Type: IV induction Ventilation: Oral airway inserted - appropriate to patient size and Mask ventilation with difficulty Laryngoscope Size: Mac and 3 Grade View: Grade I Tube type: Oral Tube size: 7.5 mm Number of attempts: 1 Airway Equipment and Method: Stylet and Video-laryngoscopy Placement Confirmation: ETT inserted through vocal cords under direct vision,  positive ETCO2 and breath sounds checked- equal and bilateral Secured at: 23 cm Tube secured with: Tape Dental Injury: Teeth and Oropharynx as per pre-operative assessment  Difficulty Due To: Difficulty was anticipated and Difficult Airway-  due to neck instability Comments: Pt with known history of cervical injury and neck instability.  Induced with propofol and succinylcholine.  Two handed mask ventilation noted and oral airway in place.   Intubated with video laryngoscope with direct visualization of vocal cords.  7.5 ETT passed easily with video scope stylet.  Dr. Sampson Goon verified placement of ETT.  Carlynn Herald, CRNA

## 2012-07-25 NOTE — Op Note (Signed)
Procedure(s): LEFT SHOULDER HEMI-ARTHROPLASTY Procedure Note  Melissa Webb female 62 y.o. 07/25/2012  Procedure(s) and Anesthesia Type:    * LEFT SHOULDER HEMI-ARTHROPLASTY - General  Surgeon(s) and Role:    * Mable Paris, MD - Primary   Indications:  62 y.o. female  status post nonoperative treatment of a 4 part left proximal humerus fracture several years ago. 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 She was in pain management but are not have continued pain and limited her quality of life and her sleep. She was to go forward with an arthroplasty procedure to increase her quality of life and decrease her pain and dependence on pain medication. She understood risks benefits and alternatives to the procedure and understands that these risks are site higher given her malunion anatomy.     Surgeon: Mable Paris   Assistants: Damita Lack PA-C [facility] was present and scrubbed throughout the procedure and was essential in positioning, retraction, exposure, and closure)  Anesthesia: General endotracheal anesthesia     Procedure Detail  LEFT SHOULDER HEMI-ARTHROPLASTY  Findings: She has significant malunion and the head was in quite a bit of varus. She had severe avascular necrosis with significant defect on the humeral head. A large posterior medial osteophyte was removed. The glenoid was intact with some thinning no loss of cartilage. Humeral head replacement was performed with a short stem using the DePuy AP variable neck to allow more varus head cut. No attempt was made at osteotomizing and moving the malunited greater tuberosity. A lesser tuberosity osteotomy was performed and repaired at the end.  Estimated Blood Loss:  350 mL         Drains: 1 medium hemovac  Blood Given: none          Specimens: none        Complications:  * No complications entered in OR log *         Disposition: PACU -  hemodynamically stable.         Condition: stable    Procedure:   The patient was identified in the preoperative holding area where I personally marked the operative extremity after verifying with the patient and consent. She  was taken to the operating room where She was transferred to the   operative table.  The patient received an interscalene block in   the holding area by the attending anesthesiologist.  General anesthesia was induced   in the operating room without complication.  The patient did receive IV  Ancef prior to the commencement of the procedure.  The patient was   placed in the beach-chair position with the back raised about 30   degrees.  The nonoperative extremity and head and neck were carefully   positioned and padded protecting against neurovascular compromise.  The   left upper extremity was then prepped and draped in the standard sterile   fashion.    The appropriate operative time-out was performed with   Anesthesia, the perioperative staff, as well as myself and we all agreed   that the left side was the correct operative site.  An approximately   10 cm incision was made from the tip of the coracoid to the center point of the   humerus at the level of the axilla.  Dissection was carried down sharply   through subcutaneous tissues and cephalic vein was identified and taken   laterally with the deltoid.  The pectoralis major  was taken medially.  The   upper 1 cm of the pectoralis major was released from its attachment on   the humerus.  The clavipectoral fascia was incised just lateral to the   conjoined tendon.  This incision was carried up to but not into the   coracoacromial ligament.  Digital palpation was used to prove   integrity of the axillary nerve which was protected throughout the   procedure.  Musculocutaneous nerve was not palpated in the operative   field.  Conjoined tendon was then retracted gently medially and the   deltoid laterally. She was  noted to have a fair amount of subdeltoid adhesions which were carefully freed. Anterior circumflex humeral vessels were clamped and   coagulated.  The soft tissues overlying the biceps was incised and this   incision was carried across the transverse humeral ligament to the base   of the coracoid.  The biceps was tenodesed to the soft tissue just above   pectoralis major and the remaining portion of the biceps superiorly was   excised.  An osteotomy was performed at the lesser tuberosity using a large curved osteotome. A tag stitch was placed at the bone tendon junction. With gentle external rotation the capsule was then released from superior to inferior exposing the humeral head. Careful dissection was carried around down to about the 6:00 position exposing the large anterior posterior humeral head osteophyte. This was excised from inside out using a half-inch curved osteotome. The humeral head was then dislocated then carefully exposed. As noted to be severely arthritic with delamination of the cartilage and significant bony defects. There was noted to be in varus angulation. The greater tuberosity was healed superior and posterior. It was not felt to interfere with humeral head replacement and therefore was not osteotomized. A cut was made matching the varus of the present humeral head. This was in about 10 of retroversion. The 6 entry reamer was then used to find the intramedullary canal. It was entered in the most superior point of the humeral head cut. I was able to ream up to 10 mm. I then used the 10 mm osteotome and passing the broaches noted significant tightness of the tendon unit 8 mm osteotome to to the varus anatomy. Therefore the 6 mm stem was felt to be the best fit and went down without undue stress on the bone. We used the variable neck implant to set the angle for the humeral head cut with a 44 x 15 eccentric head trial which fit nicely. The implant was then assembled on the back table and  the appropriate angulation with a variable neck taper. 2 mm drill holes were then made with 3 on either side of the lesser tuberosity osteotomy site and 3 #2 FiberWire is were passed allowing the implant to be placed within the loops of the FiberWire around the in the intramedullary canal. Final implant was impacted with a very good press fit. The joint was reduced and felt to be a nice fit with appropriate soft tissue tension. The joint was copiously irrigated with pulse lavage. The lesser tuberosity osteotomy was then repaired back to the osteotomy site using the FiberWire sutures around the implant. With gentle external rotation at around to about 20 comfortably without significant stress on the repair. Preoperative external rotation was 5.  Wound was copiously irrigated with normal saline and subsequently closed in layers with 2-0 Vicryl and 4-0 Monocryl. Steri-Strips were applied. Sterile dressings were applied. The patient was  a sling, allowed to awaken from general anesthesia, transferred to stretcher and taken to recovery room in stable condition.      POSTOPERATIVE PLAN:  Early passive range of motion will be allowed with the goal of 0 degrees external rotation and 90 degrees forward elevation.  No internal rotation at this time.  No active motion of the arm until the lesser tuberosity heals.  The patient will likely be kept in the hospital for 1-2 days and then discharged home.

## 2012-07-25 NOTE — Progress Notes (Signed)
Utilization Review Completed.Melissa Webb T7/03/2012  

## 2012-07-25 NOTE — Anesthesia Preprocedure Evaluation (Addendum)
Anesthesia Evaluation  Patient identified by MRN, date of birth, ID band Patient awake    Reviewed: Allergy & Precautions, H&P , NPO status , Patient's Chart, lab work & pertinent test results, reviewed documented beta blocker date and time   Airway Mallampati: II TM Distance: >3 FB Neck ROM: Limited    Dental no notable dental hx. (+) Teeth Intact and Dental Advisory Given   Pulmonary neg pulmonary ROS,  breath sounds clear to auscultation  Pulmonary exam normal       Cardiovascular negative cardio ROS  Rhythm:Regular Rate:Normal     Neuro/Psych Anxiety  Neuromuscular disease negative psych ROS   GI/Hepatic Neg liver ROS, GERD-  Medicated and Controlled,  Endo/Other  negative endocrine ROS  Renal/GU negative Renal ROS  negative genitourinary   Musculoskeletal   Abdominal (+)  Abdomen: soft. Bowel sounds: normal.  Peds  Hematology negative hematology ROS (+)   Anesthesia Other Findings   Reproductive/Obstetrics negative OB ROS                       Anesthesia Physical Anesthesia Plan  ASA: II  Anesthesia Plan: General   Post-op Pain Management:    Induction: Intravenous  Airway Management Planned: Oral ETT  Additional Equipment:   Intra-op Plan:   Post-operative Plan: Extubation in OR  Informed Consent: I have reviewed the patients History and Physical, chart, labs and discussed the procedure including the risks, benefits and alternatives for the proposed anesthesia with the patient or authorized representative who has indicated his/her understanding and acceptance.   Dental advisory given  Plan Discussed with: CRNA and Surgeon  Anesthesia Plan Comments:         Anesthesia Quick Evaluation

## 2012-07-25 NOTE — H&P (Signed)
Melissa Webb is an 62 y.o. female.   Chief Complaint: L shoulder pain HPI: S/p nonop tx L proximal hum fx, with progressive avascular necrosis, severe pain.  Past Medical History  Diagnosis Date  . GERD (gastroesophageal reflux disease)   . Anxiety   . Arthritis     osteoarthritis  . Carpal tunnel syndrome of right wrist     Past Surgical History  Procedure Laterality Date  . Appendectomy    . Cholecystectomy    . Tubal ligation    . Carpal tunnel release  02/24/2011    Procedure: CARPAL TUNNEL RELEASE;  Surgeon: Fuller Canada, MD;  Location: [facility];  Service: Orthopedics;  Laterality: Right;  . Carpal tunnel release  04/14/2011    Procedure: CARPAL TUNNEL RELEASE;  Surgeon: Vickki Hearing, MD;  Location: [facility];  Service: Orthopedics;  Laterality: Left;  . Foreign body removal  05/12/2011    Procedure: REMOVAL FOREIGN BODY EXTREMITY;  Surgeon: Vickki Hearing, MD;  Location: [facility];  Service: Orthopedics;  Laterality: Left;  exploration of wrist    Family History  Problem Relation Age of Onset  . Heart disease    . Arthritis    . Cancer    . Anesthesia problems Neg Hx   . Hypotension Neg Hx   . Malignant hyperthermia Neg Hx   . Pseudochol deficiency Neg Hx    Social History:  reports that she quit smoking about 22 years ago. Her smoking use included Cigarettes. She has a 2.5 pack-year smoking history. She does not have any smokeless tobacco history on file. She reports that she does not drink alcohol or use illicit drugs.  Allergies: No Known Allergies  Medications Prior to Admission  Medication Sig Dispense Refill  . ALPRAZolam (XANAX) 1 MG tablet Take 0.5-1 mg by mouth 3 (three) times daily as needed for sleep or anxiety.      Marland Kitchen aspirin EC 81 MG tablet Take 81 mg by mouth daily.      . cyclobenzaprine (FLEXERIL) 10 MG tablet Take 10 mg by mouth 3 (three) times daily as needed for muscle spasms.      Marland Kitchen doxycycline (VIBRA-TABS) 100 MG tablet Take 100 mg by mouth  daily.      Marland Kitchen HYDROcodone-acetaminophen (NORCO/VICODIN) 5-325 MG per tablet Take 1-2 tablets by mouth every 6 (six) hours as needed for pain.      Marland Kitchen omeprazole (PRILOSEC) 20 MG capsule Take 40 mg by mouth every morning.      . traMADol (ULTRAM) 50 MG tablet Take 50-100 mg by mouth every 6 (six) hours as needed for pain.        No results found for this or any previous visit (from the past 48 hour(s)). No results found.  Review of Systems  All other systems reviewed and are negative.    Blood pressure 106/71, pulse 88, temperature 97.7 F (36.5 C), temperature source Oral, resp. rate 20, SpO2 97.00%. Physical Exam  Constitutional: She is oriented to person, place, and time. She appears well-developed and well-nourished.  HENT:  Head: Atraumatic.  Eyes: EOM are normal.  Cardiovascular: Intact distal pulses.   Respiratory: Effort normal.  Neurological: She is alert and oriented to person, place, and time.  Skin: Skin is warm and dry.  Psychiatric: She has a normal mood and affect.     Assessment/Plan L shoulder AVN, malunion Plan L shoulder hemiarthroplasty Risks / benefits of surgery discussed Consent on chart  NPO for OR Preop antibiotics  Mable Paris 07/25/2012, 7:17 AM

## 2012-07-25 NOTE — Anesthesia Postprocedure Evaluation (Signed)
  Anesthesia Post-op Note  Patient: Melissa Webb  Procedure(s) Performed: Procedure(s): LEFT SHOULDER HEMI-ARTHROPLASTY (Left)  Patient Location: PACU  Anesthesia Type:General  Level of Consciousness: awake and alert   Airway and Oxygen Therapy: Patient Spontanous Breathing and Patient connected to nasal cannula oxygen  Post-op Pain: moderate  Post-op Assessment: Post-op Vital signs reviewed, Patient's Cardiovascular Status Stable, Respiratory Function Stable, Patent Airway and No signs of Nausea or vomiting  Post-op Vital Signs: Reviewed and stable  Complications: No apparent anesthesia complications

## 2012-07-26 LAB — CBC
HCT: 30.9 % — ABNORMAL LOW (ref 36.0–46.0)
MCHC: 34.3 g/dL (ref 30.0–36.0)
MCV: 89.8 fL (ref 78.0–100.0)
Platelets: 235 10*3/uL (ref 150–400)
RDW: 13.1 % (ref 11.5–15.5)
WBC: 8.7 10*3/uL (ref 4.0–10.5)

## 2012-07-26 LAB — BASIC METABOLIC PANEL
BUN: 5 mg/dL — ABNORMAL LOW (ref 6–23)
Chloride: 105 mEq/L (ref 96–112)
Creatinine, Ser: 0.64 mg/dL (ref 0.50–1.10)
GFR calc Af Amer: >90 mL/min (ref >90)
GFR calc non Af Amer: >90 mL/min (ref >90)

## 2012-07-26 MED ORDER — OXYCODONE HCL 5 MG PO TABS: 5.0000 mg | ORAL_TABLET | ORAL | Status: DC | PRN

## 2012-07-26 MED ORDER — DOXYCYCLINE HYCLATE 100 MG PO TABS
100.0000 mg | ORAL_TABLET | Freq: Every day | ORAL | Status: DC
  Administered 2012-07-26 – 2012-07-27 (×2): 100 mg via ORAL
  Filled 2012-07-26 (×2): qty 1

## 2012-07-26 NOTE — Progress Notes (Signed)
Patient still complaining of very sore head, neck and left shoulder pain. She feels that she isn't ready for discharge in the state she is in. PRN pain meds given as often as possible, patient found asleep upon pain re-assessments. Ice packs to her head, neck, and shoulder applied; pillow support and room comfort measures (ie reduced light and noise) in place. Will continue to monitor. Arnoldo Morale RN

## 2012-07-26 NOTE — Evaluation (Addendum)
Occupational Therapy Evaluation Patient Details Name: Melissa Webb MRN: [id number] DOB: [birth year] Today's Date: 07/26/2012 Time: 4098-1191 OT Time Calculation (min): 47 min  OT Assessment / Plan / Recommendation History of present illness 62 y.o. pt s/p left shoulder hemiarthroplasty   Clinical Impression   Pt presents with below problem list. Pt limited in evaluation due to pain. Pt will benefit from acute OT to increase independence prior to d/c. Pt has family/friends available to check on her intermittently. Recommending HHOT upon d/c.     OT Assessment  Recommendations for Other Services            Patient needs continued OT Services   PT consult  Follow Up Recommendations  Home health OT;Supervision/Assistance - 24 hour    Barriers to Discharge Decreased caregiver support    Equipment Recommendations  None recommended by OT    Recommendations for Other Services    Frequency  Min 2X/week    Precautions / Restrictions Precautions Precautions: Fall;Shoulder Type of Shoulder Precautions: ADL education, Sling education, AROM elbow, wrist, hand, PROM of shoulder: Teach patient forward flexion supine using opposite arm and ER using a stick.  Goal is up to 90deg forward flexion and 0 (neutral) deg of ER. Shoulder Interventions: Shoulder sling/immobilizer;Off for dressing/bathing/exercises Precaution Booklet Issued: Yes (comment) Required Braces or Orthoses: Sling Restrictions Weight Bearing Restrictions: Yes LUE Weight Bearing: Non weight bearing   Pertinent Vitals/Pain Pain 10/10. Nurse brought pain meds during session. Repositioned.     ADL  Toilet Transfer: Hydrographic surveyor Method: Sit to Barista: Comfort height toilet;Grab bars Toileting - Architect and Hygiene: Min guard Where Assessed - Engineer, mining and Hygiene: Sit on 3-in-1 or toilet;Standing Equipment Used: Gait belt;Other (comment) (shoulder  sling) ADL Comments: Reviewed all shoulder information with pt and practiced exercises. Attempted to practiced shower transfer, but pt presented with balance deficits and adviced pt not to perform shower transfer for a while and to sponge bathe.     OT Diagnosis: Acute pain  OT Problem List: Decreased strength;Decreased range of motion;Decreased activity tolerance;Impaired balance (sitting and/or standing);Decreased knowledge of use of DME or AE;Decreased knowledge of precautions;Pain;Impaired UE functional use OT Treatment Interventions: Self-care/ADL training;DME and/or AE instruction;Therapeutic activities;Patient/family education;Balance training   OT Goals(Current goals can be found in the care plan section) Acute Rehab OT Goals OT Goal Formulation: With patient Time For Goal Achievement: 08/02/12 Potential to Achieve Goals: Good  Visit Information  Last OT Received On: 07/26/12 Assistance Needed: +1 History of Present Illness: 62 y.o. pt s/p left shoulder hemiarthroplasty       Prior Functioning     Home Living Family/patient expects to be discharged to:: Private residence Living Arrangements: Non-relatives/Friends Available Help at Discharge: Family/Friends;Available PRN/intermittently Home Equipment: Cane - single point Prior Function Level of Independence: Needs assistance ADL's / Homemaking Assistance Needed: Needs assistance with bathing, dressing, cooking, cleaning Communication Communication: No difficulties         Vision/Perception     Cognition  Cognition Arousal/Alertness: Awake/alert Behavior During Therapy: WFL for tasks assessed/performed Overall Cognitive Status: Within Functional Limits for tasks assessed    Extremity/Trunk Assessment       Mobility Bed Mobility Bed Mobility: Supine to Sit;Sit to Supine Supine to Sit: 4: Min assist Sit to Supine: 4: Min guard Details for Bed Mobility Assistance: Min A to lift trunk.  Transfers Transfers:  Sit to Stand;Stand to Sit Sit to Stand: 4: Min guard;From bed;From toilet Stand to Sit:  4: Min guard;To bed;To toilet     Exercise Shoulder Exercises Shoulder Flexion: Self ROM;Left;Supine;5 reps Shoulder External Rotation: Self ROM;Left;5 reps;Supine Elbow Flexion: AROM;Left;10 reps;Standing Elbow Extension: AROM;Left;10 reps;Standing Wrist Flexion: AROM;Left;10 reps;Seated Wrist Extension: AROM;Left;10 reps;Seated Digit Composite Flexion: AROM;Left;10 reps;Seated Composite Extension: AROM;Left;10 reps;Seated Neck Flexion: AROM;5 reps;Seated Neck Extension: AROM;5 reps;Seated Donning/doffing shirt without moving shoulder: Supervision/safety Method for sponge bathing under operated UE: Supervision/safety Donning/doffing sling/immobilizer: Minimal assistance (pt states family and friends know how to don/doff sling ) Correct positioning of sling/immobilizer: Minimal assistance ROM for elbow, wrist and digits of operated UE: Minimal assistance Sling wearing schedule (on at all times/off for ADL's): Supervision/safety Proper positioning of operated UE when showering: Supervision/safety Positioning of UE while sleeping: Supervision/safety   Balance     End of Session OT - End of Session Equipment Utilized During Treatment: Gait belt;Other (comment) (shoulder sling) Activity Tolerance: Patient limited by pain Patient left: in bed;with call bell/phone within reach Nurse Communication: Mobility status  GO     Earlie Raveling OTR/L [phone]  07/26/2012, 1:20 PM

## 2012-07-26 NOTE — Plan of Care (Deleted)
Problem: Acute Rehab OT Goals Goal: Pt. Will Transfer To Toilet Comfort height toilet
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
## 2012-07-26 NOTE — Progress Notes (Signed)
Spoke with Jiles Harold PA-C about the discharge plan. Ortho services does not feel that a PT eval order is needed, and the patient can discharge home this afternoon if she is ready. Arnoldo Morale RN

## 2012-07-27 NOTE — Progress Notes (Signed)
PATIENT ID: Melissa Webb   2 Days Post-Op Procedure(s) (LRB): LEFT SHOULDER HEMI-ARTHROPLASTY (Left)  Subjective: Still complains of shoulder and neck pain. Eager to go home this morning.  Objective:  Filed Vitals:   07/26/12 1400  BP: 134/80  Pulse: 116  Temp: 100.1 F (37.8 C)  Resp: 16     Left shoulder dressing clean dry and intact. Mild swelling. Distally neurovascularly intact. Proximal strength not tested secondary to pain.  Labs:   Recent Labs  07/26/12 0500  HGB 10.6*   Recent Labs  07/26/12 0500  WBC 8.7  RBC 3.44*  HCT 30.9*  PLT 235   Recent Labs  07/26/12 0500  NA 138  K 3.5  CL 105  CO2 26  BUN 5*  CREATININE 0.64  GLUCOSE 99  CALCIUM 8.2*    Assessment and Plan: Postoperative day #2 status post left shoulder hemiarthroplasty for avascular necrosis She will learn some exercises with occupational therapy prior to discharge today. She will followup with me in 2 weeks

## 2012-07-27 NOTE — Progress Notes (Addendum)
Occupational Therapy Treatment Patient Details Name: Melissa Webb MRN: [id number] DOB: September 24, 1950 Today's Date: 07/27/2012 Time: 1478-2956 OT Time Calculation (min): 30 min  OT Assessment / Plan / Recommendation  OT comments  Pt ready for d/c when OT arrived. Pt practiced exercises and OT reviewed information with pt and fiance. Pt presents with balance deficits and was limited due to pain. Notified nurse yesterday that OT recommended a PT consult for pt. Spoke with Dr. Ave Filter and notified him as well. Recommending HHOT/HHPT upon d/c. Discussed with nurse situation and she called the case manager.   Follow Up Recommendations  Home health OT;Supervision/Assistance - 24 hour    Barriers to Discharge       Equipment Recommendations  None recommended by OT    Recommendations for Other Services PT consult  Frequency Min 2X/week   Progress towards OT Goals Progress towards OT goals: Progressing toward goals  Plan Discharge plan remains appropriate    Precautions / Restrictions Precautions Precautions: Fall;Shoulder Type of Shoulder Precautions: ADL education, Sling education, AROM elbow, wrist, hand, PROM of shoulder: Teach patient forward flexion supine using opposite arm and ER using a stick.  Goal is up to 90deg forward flexion and 0 (neutral) deg of ER. Shoulder Interventions: Shoulder sling/immobilizer;Off for dressing/bathing/exercises Precaution Booklet Issued: Yes (comment) Required Braces or Orthoses: Sling Restrictions Weight Bearing Restrictions: Yes LUE Weight Bearing: Non weight bearing   Pertinent Vitals/Pain Pain 10+.Marland KitchenMarland KitchenRepositioned. [employer].     ADL  Toilet Transfer: Hydrographic surveyor Method: Sit to Barista: Other (comment) (from bed) Tub/Shower Transfer: Minimal assistance Tub/Shower Transfer Method: Science writer: Walk in shower Equipment Used: Other (comment) (shoulder sling and straight  cane) Transfers/Ambulation Related to ADLs: Minguard/Min A level. Recommended pt use cane at home for balance. ADL Comments: Reviewed shoulder information with pt and fiance. Fiance able to don sling-cues to be careful not to move shoulder.  Pt tearful during session. Pt practiced exercises but was limited by pain.  Pt practiced shower transfer today and required hand held assist. Recommending to be sure to have fiance or someone with her when doing this at home.    OT Diagnosis:    OT Problem List:   OT Treatment Interventions:     OT Goals(current goals can now be found in the care plan section) Acute Rehab OT Goals OT Goal Formulation: With patient Time For Goal Achievement: 08/02/12 Potential to Achieve Goals: Good ADL Goals Pt Will Transfer to Toilet: with modified independence;ambulating Pt Will Perform Tub/Shower Transfer: Shower transfer;with supervision;ambulating Pt/caregiver will Perform Home Exercise Program: reft upper extremity;with Supervision;with written HEP provided Additional ADL Goal #1: Pt will be independent in bathing under armpit while maintaining shoulder precautions/she will be able to independently direct caregiver.  Additional ADL Goal #2: Pt will be able to independently direct caregiver in correct position of her LUE when showering and sleeping.   Visit Information  Last OT Received On: 07/27/12 Assistance Needed: +1 History of Present Illness: 62 y.o. pt s/p left shoulder hemiarthroplasty    Subjective Data      Prior Functioning       Cognition  Cognition Arousal/Alertness: Awake/alert Behavior During Therapy: WFL for tasks assessed/performed Overall Cognitive Status: Within Functional Limits for tasks assessed    Mobility  Bed Mobility Bed Mobility: Supine to Sit;Sit to Supine Supine to Sit: 4: Min assist Sit to Supine: 4: Min guard Details for Bed Mobility Assistance: Min A to lift trunk from bed.  Transfers Transfers: Sit to Stand;Stand  to Sit Sit to Stand: 4: Min guard;From bed Stand to Sit: 4: Min guard;To bed    Exercises  Shoulder Exercises Shoulder Flexion: Self ROM;Left;5 reps;Supine Shoulder External Rotation: Self ROM;Left;5 reps;Supine Elbow Flexion: AROM;Left;5 reps;Standing Elbow Extension: AROM;Left;5 reps;Standing Wrist Flexion: AROM;Left;5 reps;Standing Wrist Extension: AROM;Left;5 reps;Standing Digit Composite Flexion: AROM;Left;5 reps Composite Extension: AROM;Left;5 reps;Standing Donning/doffing shirt without moving shoulder: Supervision/safety Method for sponge bathing under operated UE: Supervision/safety Donning/doffing sling/immobilizer: Supervision/safety Correct positioning of sling/immobilizer: Supervision/safety ROM for elbow, wrist and digits of operated UE: Min-guard Sling wearing schedule (on at all times/off for ADL's): Supervision/safety Proper positioning of operated UE when showering: Supervision/safety Positioning of UE while sleeping: Patient able to independently direct caregiver   Balance     End of Session OT - End of Session Equipment Utilized During Treatment: Other (comment) (shoulder sling) Activity Tolerance: Patient limited by pain Patient left: Other (comment);with family/visitor present (sitting EOB) Nurse Communication: Mobility status  GO     Earlie Raveling OTR/L [phone] 07/27/2012, 1:06 PM

## 2012-07-28 DIAGNOSIS — M8700 Idiopathic aseptic necrosis of unspecified bone: Secondary | ICD-10-CM

## 2012-07-28 NOTE — Progress Notes (Signed)
Addendum 07/28/12 16:30 W. Aundria Rud RN CM noted pt on work list 7/6/ for discharge planning with [facility] PT/OT. Pt noted to be discharged from hospital 7/5 as per chart.  CM not made aware of HH orders before discharge. Called and spoke with Lyanne Co RN on 5N which discharged the patient. She stated, patient was very anxious to leave the hospital. Attempted several times today to contact patient at numbers [phone] listed to set up [facility], but unable to reach her. Left a return message on voicemail. Daughter April at [phone] returned call, and said she will see mom in the morning. Daughter states, Mom lives here in [place] with boyfriend. CM office Phone number given to have patient call discuss POC and possible arrange [facility] services.  CM will cont. to f/u.

## 2012-07-28 NOTE — Discharge Summary (Signed)
Patient ID: Melissa Webb MRN: [id number] DOB/AGE: [birth year] 62 y.o.  Admit date: 07/25/2012 Discharge date: 07/28/2012  Admission Diagnoses:  Principal Problem:   Avascular necrosis of bone   Discharge Diagnoses:  Same  Past Medical History  Diagnosis Date  . GERD (gastroesophageal reflux disease)   . Anxiety   . Arthritis     osteoarthritis  . Carpal tunnel syndrome of right wrist     Surgeries: Procedure(s): LEFT SHOULDER HEMI-ARTHROPLASTY on 07/25/2012   Consultants:    Discharged Condition: Improved  Hospital Course: Melissa Webb is an 62 y.o. female who was admitted 07/25/2012 for operative treatment ofAvascular necrosis of bone. S/p nonop tx L proximal hum fx, with progressive avascular necrosis, severe pain. Patient has severe unremitting pain that affects sleep, daily activities, and work/hobbies. After pre-op clearance the patient was taken to the operating room on 07/25/2012 and underwent  Procedure(s): LEFT SHOULDER HEMI-ARTHROPLASTY.    Patient was given perioperative antibiotics: Anti-infectives   Start     Dose/Rate Route Frequency Ordered Stop   07/26/12 1000  doxycycline (VIBRA-TABS) tablet 100 mg  Status:  Discontinued    Comments:  Home medication, taken prior to admission for c.diff prophylaxis   100 mg Oral Daily 07/26/12 0919 07/27/12 1412   07/25/12 1400  ceFAZolin (ANCEF) IVPB 1 g/50 mL premix     1 g 100 mL/hr over 30 Minutes Intravenous Every 6 hours 07/25/12 1212 07/26/12 0230   07/25/12 1300  doxycycline (VIBRA-TABS) tablet 100 mg  Status:  Discontinued     100 mg Oral Daily 07/25/12 1212 07/26/12 0919   07/25/12 0600  ceFAZolin (ANCEF) IVPB 2 g/50 mL premix     2 g 100 mL/hr over 30 Minutes Intravenous On call to O.R. 07/24/12 1420 07/25/12 0727       Patient was given sequential compression devices, early ambulation, and ASA 325mg  BID to prevent DVT.  Patient benefited maximally from hospital stay, she stayed two night posteoperatively for pain  control, and there were no complications.   Recent vital signs: No data found.    Recent laboratory studies:  Recent Labs  07/26/12 0500  WBC 8.7  HGB 10.6*  HCT 30.9*  PLT 235  NA 138  K 3.5  CL 105  CO2 26  BUN 5*  CREATININE 0.64  GLUCOSE 99  CALCIUM 8.2*     Discharge Medications:     Medication List    STOP taking these medications       HYDROcodone-acetaminophen 5-325 MG per tablet  Commonly known as:  NORCO/VICODIN      TAKE these medications       ALPRAZolam 1 MG tablet  Commonly known as:  XANAX  Take 0.5-1 mg by mouth 3 (three) times daily as needed for sleep or anxiety.     aspirin EC 81 MG tablet  Take 81 mg by mouth daily.     cyclobenzaprine 10 MG tablet  Commonly known as:  FLEXERIL  Take 10 mg by mouth 3 (three) times daily as needed for muscle spasms.     doxycycline 100 MG tablet  Commonly known as:  VIBRA-TABS  Take 100 mg by mouth daily.     omeprazole 20 MG capsule  Commonly known as:  PRILOSEC  Take 40 mg by mouth every morning.     oxyCODONE 5 MG immediate release tablet  Commonly known as:  Oxy IR/ROXICODONE  Take 1-3 tablets (5-15 mg total) by mouth every 4 (four) hours as needed.  traMADol 50 MG tablet  Commonly known as:  ULTRAM  Take 50-100 mg by mouth every 6 (six) hours as needed for pain.        Diagnostic Studies: Dg Chest 2 View  07/22/2012   *RADIOLOGY REPORT*  Clinical Data: Preop for shoulder surgery, former smoking history  CHEST - 2 VIEW  Comparison: Portable chest x-ray of 06/29/2011 and CT chest of 06/30/2011  Findings: No active infiltrate or effusion is seen.  Mild apical pleural thickening is present.  Mediastinal contours are stable. The heart is within normal limits in size.  The right upper lobe nodule noted on prior CT is not visible by chest x-ray.  Follow-up CT of the chest may be helpful to assess stability.  No acute bony abnormality is seen.  IMPRESSION: No active lung disease.  The nodule noted  by CT within the right upper lobe previously is not definitely seen by chest x-ray. Consider repeat CT of the chest.   Original Report Authenticated By: Dwyane Dee, M.D.   Ct Shoulder Left Wo Contrast  07/17/2012   *RADIOLOGY REPORT*  Clinical Data: Shoulder pain.  Left humeral head fracture in June 2013.  CT OF THE LEFT SHOULDER WITHOUT CONTRAST  Technique:  Multidetector CT imaging was performed according to the standard protocol. Multiplanar CT image reconstructions were also generated.  Comparison: Radiographs dated 06/30/2011 and 05/21/2012 and a CT scan of the chest dated 06/30/2011  Findings: The prior fracture has completely healed with marked residual distortion of the humeral head.  There are prominent projections of bone from the anterior medial and posterior medial aspects of the deformed humeral head which should significantly limit internal and external rotation.  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  There are at least two tiny loose bodies in the anterior aspect of the glenohumeral joint.  There are only minimal degenerative changes of the glenoid.  The [facility] joint demonstrates no significant abnormality.  The muscles of the rotator cuff appear normal with no atrophy or edema.  IMPRESSION:  1.  Marked distortion of the anatomy of the humeral head secondary to the healed fracture. 2.  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 3.  Deformity should limit internal and external rotation. 4.  Tiny calcified loose bodies in the joint. 5.  Intact rotator cuff.   Original Report Authenticated By: Francene Boyers, M.D.   Dg Shoulder Left Port  07/25/2012   *RADIOLOGY REPORT*  Clinical Data: Status post left shoulder  hemiarthroplasty.  PORTABLE LEFT SHOULDER - 2+ VIEW  Comparison: Left shoulder radiographs 05/21/2012.  Findings: Postoperative changes of left shoulder hemiarthroplasty are noted.  No definite periprosthetic fracture or other immediate complicating features is identified within the proximal left humerus.  The prosthetic humeral head projects in the expected location of the glenoid fossa on this single-view examination. There is a surgical drain in place.  IMPRESSION: 1.  Expected postoperative appearance of the left shoulder following hemiarthroplasty, as above.   Original Report Authenticated By: Trudie Reed, M.D.    Disposition: 01-Home or Self Care      Discharge Orders   Future Orders Complete By Expires     Call MD / Call 911  As directed     Comments:  If you experience chest pain or shortness of breath, CALL 911 and be transported to the hospital emergency room.  If you develope a fever above 101 F, pus (white drainage) or increased drainage or redness at the wound, or calf pain, call your surgeon's office.    Constipation Prevention  As directed     Comments:      Drink plenty of fluids.  Prune juice may be helpful.  You may use a stool softener, such as Colace (over the counter) 100 mg twice a day.  Use MiraLax (over the counter) for constipation as needed.    Diet - low sodium heart healthy  As directed     Driving restrictions  As directed     Comments:      No driving until cleared by physician.    Increase activity slowly as tolerated  As directed     Lifting restrictions  As directed     Comments:      No lifting until cleared by physician.       Follow-up Information   Follow up with Mable Paris, MD. Schedule an appointment as soon as possible for a visit in 2 weeks.   Contact information:   [address] [place] [address] [phone]        Signed: Jiles Harold 07/28/2012, 8:26 AM

## 2012-07-30 NOTE — Care Management Note (Signed)
    Page 1 of 1   07/30/2012     2:40:15 PM   CARE MANAGEMENT NOTE 07/30/2012  Patient:  Melissa Webb, Melissa Webb   Account Number:  [id number]  Date Initiated:  07/28/2012  Documentation initiated by:  Michel Bickers  Subjective/Objective Assessment:     Action/Plan:   CM F/U for [facility]   Anticipated DC Date:  07/27/2012   Anticipated DC Plan:           Choice offered to / List presented to:             Status of service:  Completed, signed off Medicare Important Message given?   (If response is "NO", the following Medicare IM given date fields will be blank) Date Medicare IM given:   Date Additional Medicare IM given:    Discharge Disposition:  HOME/SELF CARE  Per UR Regulation:    If discussed at Long Length of Stay Meetings, dates discussed:    Comments:  07/30/12 Called Dr. Veda Canning office and clarified per Jiles Harold PA that patient does not need HHPT/OT, they will set her up with outpatient therapy at her f/u visit. Left message on patent's voicemail requesting callback.Jacquelynn Cree RN, BSN, CCM  07/29/12 Left voicemail on patient's cell phone #. Received call back from patient, she stated that she is in so much pain that she does not want to talk about therapy. Advised patient to call MD office, she stated that she would. Patient stated that she did have someone with her and that she would call me back later about therapy. Jacquelynn Cree RN, BSN, CCM  Addendum 07/28/12 16:30 W. Aundria Rud RN CM noted pt on work list 7/6/ for discharge planning with [facility] PT/OT. Pt noted to be discharged from hospital 7/5 as per chart.  CM not made aware of HH orders before discharge. Called and spoke with Lyanne Co RN on 5N which discharged the patient. She stated, patient was very anxious to leave the hospital. Attempted several times today to contact patient at numbers [phone] listed to set up [facility], but unable to reach her. Left a return message on voicemail. Daughter April at [phone] returned call, and said  she will see mom in the morning. Daughter states, Mom lives here in [place] with boyfriend. CM office Phone number given to have patient call discuss POC and possible arrange [facility] services.  CM will cont. to f/u.

## 2012-08-01 ENCOUNTER — Encounter (HOSPITAL_COMMUNITY): Payer: Self-pay | Admitting: Orthopedic Surgery

## 2013-05-02 ENCOUNTER — Ambulatory Visit (INDEPENDENT_AMBULATORY_CARE_PROVIDER_SITE_OTHER): Payer: Medicaid Other | Admitting: Internal Medicine

## 2013-05-02 ENCOUNTER — Encounter: Payer: Self-pay | Admitting: Internal Medicine

## 2013-05-02 ENCOUNTER — Ambulatory Visit
Admission: RE | Admit: 2013-05-02 | Discharge: 2013-05-02 | Disposition: A | Discharge status: Home / Self Care | Payer: Medicaid Other | Source: Ambulatory Visit | Attending: Internal Medicine | Admitting: Internal Medicine

## 2013-05-02 VITALS — BP 91/64 | HR 80 | Temp 97.6°F | Ht 62.0 in | Wt 183.0 lb

## 2013-05-02 DIAGNOSIS — R0781 Pleurodynia: Secondary | ICD-10-CM

## 2013-05-02 DIAGNOSIS — R079 Chest pain, unspecified: Secondary | ICD-10-CM

## 2013-05-02 LAB — COMPLETE METABOLIC PANEL WITH GFR
ALBUMIN: 4 g/dL (ref 3.5–5.2)
ALT: 27 U/L (ref 0–35)
AST: 24 U/L (ref 0–37)
Alkaline Phosphatase: 111 U/L (ref 39–117)
BUN: 10 mg/dL (ref 6–23)
CALCIUM: 9.2 mg/dL (ref 8.4–10.5)
CHLORIDE: 103 meq/L (ref 96–112)
CO2: 25 meq/L (ref 19–32)
Creat: 0.73 mg/dL (ref 0.50–1.10)
GFR, Est African American: >89 mL/min
GFR, Est Non African American: >89 mL/min
Glucose, Bld: 78 mg/dL (ref 70–99)
POTASSIUM: 4.4 meq/L (ref 3.5–5.3)
Sodium: 137 mEq/L (ref 135–145)
Total Bilirubin: 0.3 mg/dL (ref 0.2–1.2)
Total Protein: 6.6 g/dL (ref 6.0–8.3)

## 2013-05-02 LAB — CBC WITH DIFFERENTIAL/PLATELET
BASOS ABS: 0 10*3/uL (ref 0.0–0.1)
Basophils Relative: 0 % (ref 0–1)
Eosinophils Absolute: 0.4 10*3/uL (ref 0.0–0.7)
Eosinophils Relative: 4 % (ref 0–5)
HEMATOCRIT: 36.9 % (ref 36.0–46.0)
HEMOGLOBIN: 12.5 g/dL (ref 12.0–15.0)
LYMPHS ABS: 3.5 10*3/uL (ref 0.7–4.0)
LYMPHS PCT: 34 % (ref 12–46)
MCH: 28.1 pg (ref 26.0–34.0)
MCHC: 33.9 g/dL (ref 30.0–36.0)
MCV: 82.9 fL (ref 78.0–100.0)
MONO ABS: 0.8 10*3/uL (ref 0.1–1.0)
Monocytes Relative: 8 % (ref 3–12)
NEUTROS ABS: 5.6 10*3/uL (ref 1.7–7.7)
Neutrophils Relative %: 54 % (ref 43–77)
Platelets: 346 10*3/uL (ref 150–400)
RBC: 4.45 MIL/uL (ref 3.87–5.11)
RDW: 15.9 % — AB (ref 11.5–15.5)
WBC: 10.3 10*3/uL (ref 4.0–10.5)

## 2013-05-02 NOTE — Progress Notes (Signed)
Cc: folliculitis Subjective:    Patient ID: Melissa Webb, female    DOB: 07/14/1950, 63 y.o.   MRN: [id number]  HPI 63yo F with history of folliculitis that dates back to 2010. She states that she has been periodically treated for cellulitis, folliculitis with doxycycline and minocycline. She states that after having these antibiotics she still suffered from skin lesions predominantly on scalp. She found them intensely pruritic. She was recently given bactrim from recent ed visit which has cured her lesions. She also states that she feels subcutaneous "lumps" in right upper quadrant and occ on calf. None of which are present at today's visit. She also has tenderness to right lower rib unclear from trauma. She states that she had "recently". No fever, chills, nightsweats, diarrhea, rash. She states that she wants a scan for "lumps"  Current Outpatient Prescriptions on File Prior to Visit  Medication Sig Dispense Refill  . ALPRAZolam (XANAX) 1 MG tablet Take 0.5-1 mg by mouth 3 (three) times daily as needed for sleep or anxiety.      Marland Kitchen. aspirin EC 81 MG tablet Take 81 mg by mouth daily.      Marland Kitchen. omeprazole (PRILOSEC) 20 MG capsule Take 40 mg by mouth every morning.      . traMADol (ULTRAM) 50 MG tablet Take 50-100 mg by mouth every 6 (six) hours as needed for pain.       No current facility-administered medications on file prior to visit.   Active Ambulatory Problems    Diagnosis Date Noted  . CTS (carpal tunnel syndrome) 02/15/2011  . S/P carpal tunnel release 05/25/2011  . Ulnar nerve compression 06/07/2011  . TFCC (triangular fibrocartilage complex) tear 06/22/2011  . Fracture of left humerus 06/30/2011  . Cervical strain 06/30/2011  . DISI (dorsal intercalated segment instability) 12/14/2011  . Cubital tunnel syndrome on right 12/14/2011  . 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 Left shoulder pain 05/21/2012  . Avascular necrosis of bone 07/28/2012   Resolved  Ambulatory Problems    Diagnosis Date Noted  . No Resolved Ambulatory Problems   Past Medical History  Diagnosis Date  . GERD (gastroesophageal reflux disease)   . Anxiety   . Arthritis   . Carpal tunnel syndrome of right wrist       Review of Systems 10 point ros is negative except what is mentioned in hpi    Objective:   Physical Exam BP 91/64  Pulse 80  Temp(Src) 97.6 F (36.4 C) (Oral)  Ht 5\' 2"  (1.575 m)  Wt 183 lb (83.008 kg)  BMI 33.46 kg/m2 Physical Exam  Constitutional:  oriented to person, place, and time. appears well-developed and well-nourished. No distress.  HENT:  Mouth/Throat: Oropharynx is clear and moist. No oropharyngeal exudate.  Cardiovascular: Normal rate, regular rhythm and normal heart sounds. Exam reveals no gallop and no friction rub.  No murmur heard.  Pulmonary/Chest: Effort normal and breath sounds normal. No respiratory distress.  has no wheezes.  Abdominal: Soft. Bowel sounds are normal.  exhibits no distension. There is no tenderness.  Lymphadenopathy: no cervical adenopathy.  Neurological: alert and oriented to person, place, and time.  Skin: Skin is warm and dry. No rash noted. No erythema. No subcutaneous nodules, no ulcers found on scalp. Mild excoriation marks to lower back. Scattered sebborhic kerotisis lesion on lower extremities Psychiatric: a normal mood and affect.  behavior is normal.     Assessment & Plan:  Folliculitis/cellulitis = appears to be resolved. No  need for antibiotics at this time. Can continue to use bactrim DS 1 tab BID if needed  Chest wall pain = will check chest xray.  Subcutaneous nodules = not found on current exam. Will defer Dr. Nettie Elm for further evaluation. At this time , i don't feel that she needs CT scan for this symptomology.  Thank you for referral.  Will not have follow up unless abnormalities on today's work up

## 2013-07-03 ENCOUNTER — Emergency Department (HOSPITAL_COMMUNITY): Payer: Medicaid Other

## 2013-07-03 ENCOUNTER — Encounter (HOSPITAL_COMMUNITY): Payer: Self-pay | Admitting: Emergency Medicine

## 2013-07-03 ENCOUNTER — Emergency Department (HOSPITAL_COMMUNITY)
Admission: EM | Admit: 2013-07-03 | Discharge: 2013-07-03 | Disposition: A | Discharge status: Home / Self Care | Payer: Medicaid Other | Attending: Emergency Medicine | Admitting: Emergency Medicine

## 2013-07-03 DIAGNOSIS — M1990 Unspecified osteoarthritis, unspecified site: Secondary | ICD-10-CM | POA: Insufficient documentation

## 2013-07-03 DIAGNOSIS — Y929 Unspecified place or not applicable: Secondary | ICD-10-CM | POA: Insufficient documentation

## 2013-07-03 DIAGNOSIS — F411 Generalized anxiety disorder: Secondary | ICD-10-CM | POA: Insufficient documentation

## 2013-07-03 DIAGNOSIS — Z7982 Long term (current) use of aspirin: Secondary | ICD-10-CM | POA: Insufficient documentation

## 2013-07-03 DIAGNOSIS — S0990XA Unspecified injury of head, initial encounter: Secondary | ICD-10-CM | POA: Insufficient documentation

## 2013-07-03 DIAGNOSIS — K219 Gastro-esophageal reflux disease without esophagitis: Secondary | ICD-10-CM | POA: Insufficient documentation

## 2013-07-03 DIAGNOSIS — Z87891 Personal history of nicotine dependence: Secondary | ICD-10-CM | POA: Insufficient documentation

## 2013-07-03 DIAGNOSIS — G8929 Other chronic pain: Secondary | ICD-10-CM | POA: Insufficient documentation

## 2013-07-03 DIAGNOSIS — Y9389 Activity, other specified: Secondary | ICD-10-CM | POA: Insufficient documentation

## 2013-07-03 DIAGNOSIS — W1809XA Striking against other object with subsequent fall, initial encounter: Secondary | ICD-10-CM | POA: Insufficient documentation

## 2013-07-03 DIAGNOSIS — Z8669 Personal history of other diseases of the nervous system and sense organs: Secondary | ICD-10-CM | POA: Insufficient documentation

## 2013-07-03 HISTORY — DX: Carpal tunnel syndrome, unspecified upper limb: G56.00

## 2013-07-03 HISTORY — DX: Pain in left shoulder: M25.512

## 2013-07-03 HISTORY — DX: Headache: R51

## 2013-07-03 HISTORY — DX: Other chronic pain: G89.29

## 2013-07-03 MED ORDER — HYDROCODONE-ACETAMINOPHEN 5-325 MG PO TABS
2.0000 | ORAL_TABLET | Freq: Once | ORAL | Status: AC
  Administered 2013-07-03: 2 via ORAL
  Filled 2013-07-03: qty 2

## 2013-07-03 NOTE — ED Provider Notes (Signed)
CSN: [id number]     Arrival date & time 07/03/13  1416 History   First MD Initiated Contact with Patient 07/03/13 1434     Chief Complaint  Patient presents with  . Headache     HPI Pt was seen at 1450. Per pt, c/o gradual onset and persistence of constant acute flair of her chronic headache for the past 1 week. Pt states her headache began after she hit the left front side of her head on a bedside table. Describes the headache as "throbbing" and "aching." Pt did not take her usual norco today for her pain. Denies headache was sudden or maximal in onset or at any time.  Denies AMS, no visual changes, no focal motor weakness, no tingling/numbness in extremities, no fevers, no neck pain, no rash.      Past Medical History  Diagnosis Date  . GERD (gastroesophageal reflux disease)   . Anxiety   . Arthritis     osteoarthritis  . Carpal tunnel syndrome of right wrist   . Chronic left shoulder pain   . Carpal tunnel syndrome   . Chronic headaches    Past Surgical History  Procedure Laterality Date  . Appendectomy    . Cholecystectomy    . Tubal ligation    . Carpal tunnel release  02/24/2011    Procedure: CARPAL TUNNEL RELEASE;  Surgeon: Fuller Canada, MD;  Location: [facility];  Service: Orthopedics;  Laterality: Right;  . Carpal tunnel release  04/14/2011    Procedure: CARPAL TUNNEL RELEASE;  Surgeon: Vickki Hearing, MD;  Location: [facility];  Service: Orthopedics;  Laterality: Left;  . Foreign body removal  05/12/2011    Procedure: REMOVAL FOREIGN BODY EXTREMITY;  Surgeon: Vickki Hearing, MD;  Location: [facility];  Service: Orthopedics;  Laterality: Left;  exploration of wrist  . Shoulder hemi-arthroplasty Left 07/25/2012    Procedure: LEFT SHOULDER HEMI-ARTHROPLASTY;  Surgeon: Mable Paris, MD;  Location[facility];  Service: Orthopedics;  Laterality: Left;   Family History  Problem Relation Age of Onset  . Heart disease    . Arthritis    . Cancer    . Anesthesia problems  Neg Hx   . Hypotension Neg Hx   . Malignant hyperthermia Neg Hx   . Pseudochol deficiency Neg Hx    History  Substance Use Topics  . Smoking status: Former Smoker -- 0.25 packs/day for 10 years    Types: Cigarettes    Quit date: 02/19/1990  . Smokeless tobacco: Not on file  . Alcohol Use: No    Review of Systems ROS: Statement: All systems negative except as marked or noted in the HPI; Constitutional: Negative for fever and chills. ; ; Eyes: Negative for eye pain, redness and discharge. ; ; ENMT: Negative for ear pain, hoarseness, nasal congestion, sinus pressure and sore throat. ; ; Cardiovascular: Negative for chest pain, palpitations, diaphoresis, dyspnea and peripheral edema. ; ; Respiratory: Negative for cough, wheezing and stridor. ; ; Gastrointestinal: Negative for nausea, vomiting, diarrhea, abdominal pain, blood in stool, hematemesis, jaundice and rectal bleeding. ; ; Genitourinary: Negative for dysuria, flank pain and hematuria. ; ; Musculoskeletal: +head injury. Negative for back pain and neck pain. Negative for swelling.; ; Skin: Negative for pruritus, rash, abrasions, blisters, bruising and skin lesion.; ; Neuro: +headache. Negative for lightheadedness and neck stiffness. Negative for weakness, altered level of consciousness , altered mental status, extremity weakness, paresthesias, involuntary movement, seizure and syncope.      Allergies  Tetracyclines & related  Home Medications   Prior to Admission medications   Medication Sig Start Date End Date Taking? Authorizing Provider  ALPRAZolam Prudy Feeler(XANAX) 1 MG tablet Take 0.5-1 mg by mouth 3 (three) times daily as needed for sleep or anxiety.   Yes Historical Provider, MD  aspirin EC 81 MG tablet Take 81 mg by mouth daily.   Yes Historical Provider, MD  HYDROcodone-acetaminophen (NORCO) 10-325 MG per tablet Take 1-2 tablets by mouth every 6 (six) hours as needed. pain   Yes Historical Provider, MD  omeprazole (PRILOSEC) 20 MG  capsule Take 40 mg by mouth every morning.   Yes Historical Provider, MD  traMADol (ULTRAM) 50 MG tablet Take 50-100 mg by mouth every 6 (six) hours as needed for pain.   Yes Historical Provider, MD   BP 110/86  Pulse 89  Temp(Src) 97.4 F (36.3 C) (Oral)  Resp 20  Ht 5\' 3"  (1.6 m)  Wt 180 lb (81.647 kg)  BMI 31.89 kg/m2  SpO2 97% Physical Exam 1455: Physical examination:  Nursing notes reviewed; Vital signs and O2 SAT reviewed;  Constitutional: Well developed, Well nourished, Well hydrated, In no acute distress; Head:  Normocephalic, atraumatic; Eyes: EOMI, PERRL, No scleral icterus; ENMT: Mouth and pharynx normal, Mucous membranes moist; Neck: Supple, Full range of motion, No lymphadenopathy; Cardiovascular: Regular rate and rhythm, No gallop; Respiratory: Breath sounds clear & equal bilaterally, No rales, rhonchi, wheezes.  Speaking full sentences with ease, Normal respiratory effort/excursion; Chest: Nontender, Movement normal; Abdomen: Soft, Nontender, Nondistended, Normal bowel sounds; Genitourinary: No CVA tenderness; Spine:  No midline CS, TS, LS tenderness.;; Extremities: Pulses normal, No tenderness, No edema, No calf edema or asymmetry.; Neuro: AA&Ox3, Major CN grossly intact. Speech clear.  No facial droop.  No nystagmus. Grips equal. Decreased ROM/strength left shoulder per baseline, otherwise strength 5/5 equal bilat UE's and LE's.  DTR 2/4 equal bilat UE's and LE's.  No gross sensory deficits.  Normal cerebellar testing bilat UE's (finger-nose) and LE's (heel-shin). Climbs on and off stretcher easily by herself. Gait steady..; Skin: Color normal, Warm, Dry.    ED Course  Procedures     EKG Interpretation None      MDM  MDM Reviewed: previous chart, nursing note and vitals Interpretation: CT scan    Ct Head Wo Contrast 07/03/2013   CLINICAL DATA:  Larey SeatFell and hit head last week.  Headache.  EXAM: CT HEAD WITHOUT CONTRAST  TECHNIQUE: Contiguous axial images were obtained  from the base of the skull through the vertex without intravenous contrast.  COMPARISON:  CT head 06/30/2011  FINDINGS: Ventricle size is normal. Negative for acute or chronic infarction. Negative for hemorrhage or fluid collection. Negative for mass or edema. No shift of the midline structures.  Calvarium is intact.  IMPRESSION: Negative   Electronically Signed   By: Marlan Palauharles  Clark M.D.   On: 07/03/2013 16:11    1625:  CT reassuring. Pt already has pain meds (ultram and norco) at home. Pt wants to leave now. Dx and testing d/w pt.  Questions answered.  Verb understanding, agreeable to d/c home with outpt f/u.   Laray AngerKathleen M Shakthi Scipio, DO 07/04/13 1835

## 2013-07-03 NOTE — Discharge Instructions (Signed)
°Emergency Department Resource Guide °1) Find a Doctor and Pay Out of Pocket °Although you won't have to find out who is covered by your insurance plan, it is a good idea to ask around and get recommendations. You will then need to call the office and see if the doctor you have chosen will accept you as a new patient and what types of options they offer for patients who are self-pay. Some doctors offer discounts or will set up payment plans for their patients who do not have insurance, but you will need to ask so you aren't surprised when you get to your appointment. ° °2) Contact Your Local Health Department °Not all health departments have doctors that can see patients for sick visits, but many do, so it is worth a call to see if yours does. If you don't know where your local health department is, you can check in your phone book. The CDC also has a tool to help you locate your state's health department, and many state websites also have listings of all of their local health departments. ° °3) Find a Walk-in Clinic °If your illness is not likely to be very severe or complicated, you may want to try a walk in clinic. These are popping up all over the country in pharmacies, drugstores, and shopping centers. They're usually staffed by nurse practitioners or physician assistants that have been trained to treat common illnesses and complaints. They're usually fairly quick and inexpensive. However, if you have serious medical issues or chronic medical problems, these are probably not your best option. ° °No Primary Care Doctor: °- Call [facility] at  832-8000 - they can help you locate a primary care doctor that  accepts your insurance, provides certain services, etc. °- Physician Referral Service- 1-800-533-3463 ° °Chronic Pain Problems: °Organization         Address  Phone   Notes  °[facility]  (336) 297-2271 Patients need to be referred by their primary care doctor.  ° °Medication  Assistance: °Organization         Address  Phone   Notes  °[place] [facility] [address] °[place], [place] [address] (336) 641-8030 --Must be a resident of [place] °-- Must have NO insurance coverage whatsoever (no Medicaid/ Medicare, etc.) °-- The pt. MUST have a primary care doctor that directs their care regularly and follows them in the community °  °[facility]  (866) 331-1348   °[employer]  (888) 892-1162   ° °Agencies that provide inexpensive medical care: °Organization         Address  Phone   Notes  °[facility]  (336) 832-8035   °[facility]    (336) 832-7272   °[facility] [address] °[place], [place] [address] (336) 832-4777   °[facility] [address], °[place] (336) 271-4999   °[facility]    (336) 373-0678   °[facility]    (336) 272-1050   °[facility] ° [address], [place] Phone:  (336) 832-4444, Fax:  (336) 832-4440 Hours of Operation:  9 am - 6 pm, M-F.  Also accepts Medicaid/Medicare and self-pay.  °[facility] ° [address], [place] Phone: (336) 832-3150, Fax: (336) 832-3151. Hours of Operation:  8:30 am - 5:30 pm, M-F.  Also accepts Medicaid and self-pay.  °[facility] [place] [address], [place] Phone: (336) 878-6027   °[facility] [address], [place], [place] (336)723-1848, Ext. 123 Mondays & Thursdays: 7-9 AM.  First 15 patients are seen on a first come, first serve basis. °  ° °Medicaid-accepting [place] Providers: ° °Organization         Address  Phone   Notes  °[facility] [address], [place] (336) 641-2100 Also accepts self-pay patients.  °[facility] [address], [place] ° (336) 856-9996   °[facility] [address], [place]  (336) 288-8857   °[facility] [address] [place] [address], [place] (336) 299-7000   °Veita Bland [address], [place]  ° (336) 373-1557 Only accepts [place] Access Medicaid patients after they have their name applied to their card.  ° °Self-Pay (no insurance) in [place]: ° °Organization         Address  Phone   Notes  °Sickle Cell Patients, [facility] [address], [place] (336) 832-1970   °[facility] [address], [place] (336) 832-4400   °[facility] ° [address], [place] (336) 992-4800   °[facility]/Dr. Osei-Bonsu ° [address] [place] [address], [place] or [address], [place] (336) 841-8500 Phone number for both [place] and [place] locations is the same.  °[facility] [address], [place] (336) 299-0000   °[facility] [address] [place] [address], [place] or [address] (336) 852-7530 °(336) 878-2260   °[facility] [address], [place] (336) 350-1642, phone; (336) 294-5005, fax Sees patients 1st and 3rd Saturday of every month.  Must not qualify for public or private insurance (i.e. Medicaid, Medicare, [place] Health Choice, Veterans' Benefits) • Household income should be no more than 200% of the poverty level •The clinic cannot treat you if you are pregnant or think you are pregnant • Sexually transmitted diseases are not treated at the clinic.  ° ° °Dental Care: °Organization         Address  Phone  Notes  °[place] [facility] [address], [place] (336) 641-6152 Accepts children up to age 21 who are enrolled in Medicaid or [place] Health Choice; pregnant women with a Medicaid card; and children who have applied for Medicaid or [place] Health Choice, but were declined, whose parents can pay a reduced fee at time of service.  °[place]  [facility] [place]  [address], [place] (336) 641-7733 Accepts children up to age 21 who are enrolled in Medicaid or [place] Health Choice; pregnant women with a Medicaid card; and children who have applied for Medicaid or [place] Health Choice, but were declined, whose parents can pay a reduced fee at time of service.  °[facility] ° [address],  (336) 641-4533 Patients are seen by appointment only. Walk-ins are not accepted. [facility] will see patients 18 years of age and older. °Monday - Tuesday (8am-5pm) °Most Wednesdays (8:30-5pm) °$30 per visit, cash only  °[facility] ° [address], [place] (336) 641-4533 Patients are seen by appointment only. Walk-ins are not accepted. [facility] will see patients 18 years of age and older. °One   Wednesday Evening (Monthly: Volunteer Based).  $30 per visit, cash only  °[facility]  (919) 537-3737 for adults; Children under age 4, call Graduate Pediatric Dentistry at ([phone]) [phone]. Children aged 4-14, please call ([phone]) [phone] to request a pediatric application. ° Dental services are provided in all areas of dental care including fillings, crowns and bridges, complete and partial dentures, implants, gum treatment, root canals, and extractions. Preventive care is also provided. Treatment is provided to both adults and children. °Patients are selected via a lottery and there is often a waiting list. °  °[facility] [address], °[place] ° (336) 763-8833 [email or web address] °  °[facility] [address], [place], [place] (336)723-1848, Ext. 123 Second and Fourth Thursday of each month, opens at 6:30 AM; Clinic ends at 9 AM.  Patients are seen on a first-come first-served basis, and a limited number are seen during each clinic.  ° °[facility] ° [address], [place], [place] (336) 723-7904    Eligibility Requirements °You must have lived in [place], [place], or [place] counties for at least the last three months. °  You cannot be eligible for state or federal sponsored healthcare insurance, including Veterans Administration, Medicaid, or Medicare. °  You generally cannot be eligible for healthcare insurance through your employer.  °  How to apply: °Eligibility screenings are held every Tuesday and Wednesday afternoon from 1:00 pm until 4:00 pm. You do not need an appointment for the interview!  °[facility] [address], [place], [place] 336-631-2330   °[facility]  336-342-8273   °[facility]  336-703-3100   °[facility]  336-570-6415   ° °Behavioral Health Resources in the Community: °Intensive Outpatient Programs °Organization         Address  Phone  Notes  °[facility] [address], [place], [place] 336-878-6098   °[facility] [address], [place], [place] 336-832-9800   °[facility] [address], [place], [place] ° 336-882-2125   °[facility] [address],  °[place], [place] 1-800-853-5163 or 336-641-4981   °Substance Abuse Resources °Organization         Address  Phone  Notes  °[facility]  336-882-2125   °[facility]  336-784-9470   °[facility]  336-285-9073   °[facility]  336-845-3988   °Residential & Outpatient Substance Abuse Program  1-800-659-3381   °Psychological Services °Organization         Address  Phone  Notes  °[facility]  336- 832-9600   °[facility]  336- 378-7881   °[facility] [address], [place] 1-800-853-5163 or 336-641-4981   ° °Mobile Crisis Teams °Organization         Address  Phone  Notes  °[facility], Mobile Crisis Care Unit  1-877-626-1772   °Assertive °[facility] ° [address]  [place], [place] 336-834-9664   °Sharon DeEsch [address] °[place] 336-554-5454   ° °Self-Help/Support Groups °Organization         Address  Phone             Notes  °[facility] - variety of support groups  336- 373-1402 Call for more information  °Narcotics Anonymous (NA), [facility] [address], °[place]  2 meetings at this location  ° °  Residential Treatment Programs °Organization         Address  Phone  Notes  °[facility] [address],    °[place]  1-866-801-8205   °[facility] ° [address], [place], [place] 704-293-8524   °[facility] [address], [place] 336-845-3988 Admissions: 8am-3pm M-F  °[facility] [address]    °[place], [place] 336-841-1104   °[facility] [address], [place], [place] 336-379-7146   °[facility] [address]  °[place], [place] 336-285-9073   °[facility] Intensive Outpatient [address], [place], [place] 336-852-3033   °[facility] [address]  °[place], [place] 1-877-615-2722 or 336-784-9470   °[facility] [address] [place], [place] 336-227-7417 Accepts Medicaid  °[facility] [address]  °[place] 1-800-659-3381 Substance Abuse/Addiction Treatment  ° °[place] Behavioral Health Resources °Organization         Address  Phone  Notes  °[facility]  (888) 581-9988   °Julie Brannon, PhD [address] [place], [place]   (336) 349-5553 or (336) 951-0000   °[facility]   [address] °[place], [place] (336) 349-4454   °[facility] [address], [place], [place] (336) 342-8316 Insurance/Medicaid/sponsorship through [facility]  °[facility] [address] [place] [address]                                    [place], [place] (336) 342-8316 Therapy/tele-psych/case    °[facility] [address]  ° [place], [place] (336) 349-2233    °Dr. Arfeen  (336) 349-4544   °[facility] [place]  [employer] [place] [facility] 1) [address], [place] °2) [address], [place] °3)  [address], [place] (336) 349-3220 °(336) 342-7768 ° °(336) 342-8140   °[place] [facility] (336) 342-1394 or (336) 342-3537 (After Hours)    ° ° °Take your usual prescriptions as previously directed.  Call your regular medical doctor today to schedule a follow up appointment within the next 2 days.  Return to the Emergency Department immediately sooner if worsening.  ° °

## 2013-07-03 NOTE — ED Notes (Signed)
Pt reports that Inetta Fermo, [place] at Dr. Ronal Fear office sent her to ED to be assessed due to concern over falling and hitting head this past Friday. Pt reports that PA "wanted her assessed right away." Dr Richrd Prime at bedside.

## 2013-07-03 NOTE — ED Notes (Signed)
Pt ambulated to waiting room with steady gait.

## 2013-07-03 NOTE — ED Notes (Signed)
Pt states she fell last Friday and hit her head. Complain of headache now
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
## 2013-10-15 ENCOUNTER — Encounter: Payer: Self-pay | Admitting: Gastroenterology

## 2013-11-04 ENCOUNTER — Ambulatory Visit: Payer: Medicaid Other | Admitting: Gastroenterology

## 2013-12-05 ENCOUNTER — Ambulatory Visit: Payer: Medicaid Other | Admitting: Gastroenterology

## 2014-07-28 IMAGING — CR DG CHEST 2V
2 series · 2 of 2 positions shown · non-contrast
Comparison: Portable chest x-ray of 06/29/2011 and CT chest of
06/30/2011

CLINICAL DATA: Preop for shoulder surgery, former smoking history

CHEST - 2 VIEW

[w chest pa]
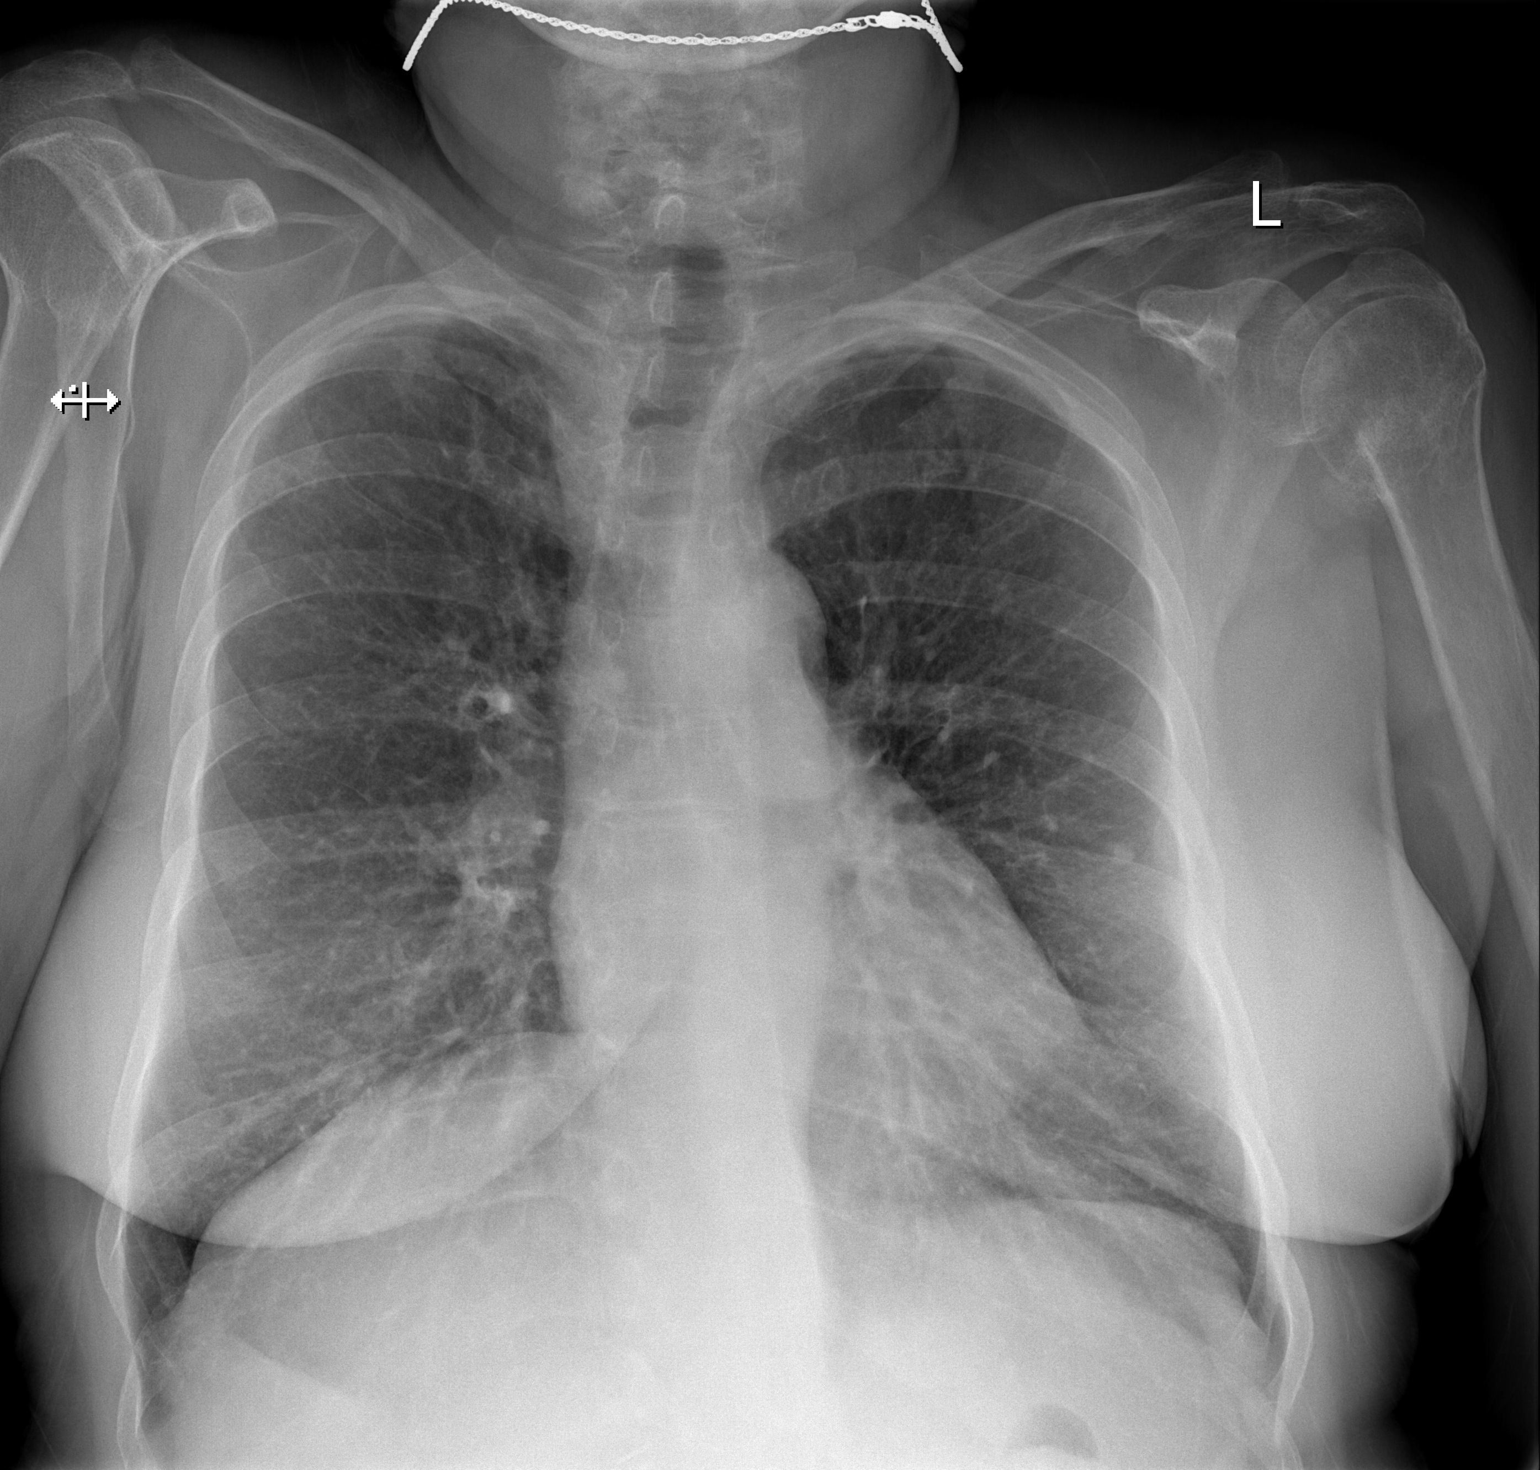

[w chest lat]
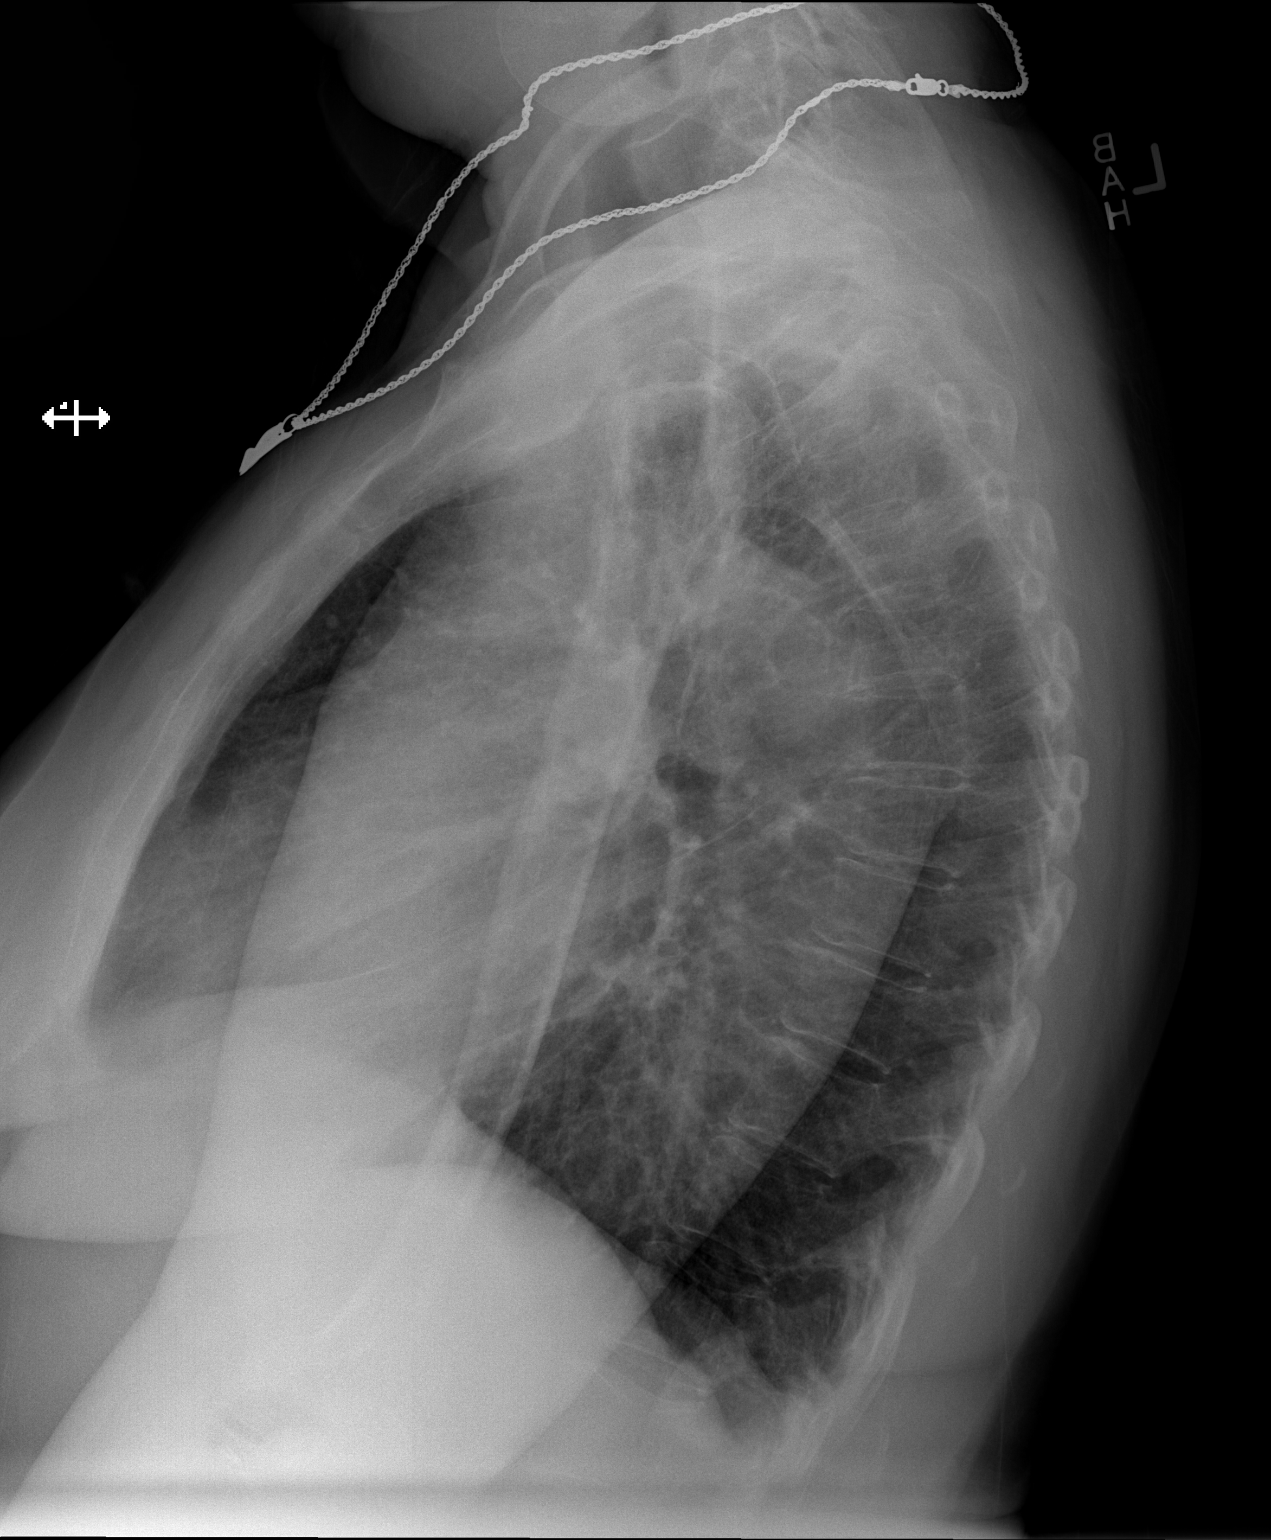

[2 of 2 positions shown; findings below may reference images not displayed]

FINDINGS: No active infiltrate or effusion is seen.  Mild apical
pleural thickening is present.  Mediastinal contours are stable.
The heart is within normal limits in size.  The right upper lobe
nodule noted on prior CT is not visible by chest x-ray.  Follow-up
CT of the chest may be helpful to assess stability.  No acute bony
abnormality is seen.
IMPRESSION: No active lung disease.  The nodule noted by CT within the right
upper lobe previously is not definitely seen by chest x-ray.
Consider repeat CT of the chest.
